# Patient Record
Sex: Female | Born: 1935 | Race: White | Hispanic: No | Smoking: Never smoker
Health system: Southern US, Community
[De-identification: ages and names within clinical notes are randomized; demographics above are authoritative.]

## PROBLEM LIST (undated history)

## (undated) DIAGNOSIS — Z1239 Encounter for other screening for malignant neoplasm of breast: Secondary | ICD-10-CM

## (undated) DIAGNOSIS — Z1211 Encounter for screening for malignant neoplasm of colon: Secondary | ICD-10-CM

## (undated) DIAGNOSIS — E669 Obesity, unspecified: Secondary | ICD-10-CM

## (undated) DIAGNOSIS — K921 Melena: Secondary | ICD-10-CM

## (undated) DIAGNOSIS — K219 Gastro-esophageal reflux disease without esophagitis: Secondary | ICD-10-CM

## (undated) DIAGNOSIS — E079 Disorder of thyroid, unspecified: Secondary | ICD-10-CM

## (undated) DIAGNOSIS — K2270 Barrett's esophagus without dysplasia: Secondary | ICD-10-CM

## (undated) DIAGNOSIS — F0390 Unspecified dementia without behavioral disturbance: Secondary | ICD-10-CM

## (undated) HISTORY — PX: TUBAL LIGATION: SHX77

## (undated) HISTORY — DX: Barrett's esophagus without dysplasia: K22.70

## (undated) HISTORY — PX: COLONOSCOPY: SHX174

## (undated) HISTORY — DX: Obesity, unspecified: E66.9

## (undated) HISTORY — DX: Melena: K92.1

## (undated) HISTORY — DX: Gastro-esophageal reflux disease without esophagitis: K21.9

## (undated) HISTORY — DX: Disorder of thyroid, unspecified: E07.9

## (undated) HISTORY — DX: Encounter for other screening for malignant neoplasm of breast: Z12.39

## (undated) HISTORY — DX: Encounter for screening for malignant neoplasm of colon: Z12.11

---

## 2004-08-13 ENCOUNTER — Ambulatory Visit: Payer: Self-pay | Admitting: General Surgery

## 2010-05-13 DIAGNOSIS — K2270 Barrett's esophagus without dysplasia: Secondary | ICD-10-CM

## 2010-05-13 HISTORY — PX: UPPER GASTROINTESTINAL ENDOSCOPY: SHX188

## 2010-05-13 HISTORY — DX: Barrett's esophagus without dysplasia: K22.70

## 2010-11-30 LAB — HM PAP SMEAR: HM PAP: NEGATIVE

## 2010-12-14 DIAGNOSIS — K219 Gastro-esophageal reflux disease without esophagitis: Secondary | ICD-10-CM

## 2010-12-14 HISTORY — DX: Gastro-esophageal reflux disease without esophagitis: K21.9

## 2010-12-15 DIAGNOSIS — K921 Melena: Secondary | ICD-10-CM

## 2010-12-15 HISTORY — DX: Melena: K92.1

## 2010-12-18 ENCOUNTER — Ambulatory Visit: Payer: Self-pay | Admitting: General Surgery

## 2010-12-18 LAB — HM COLONOSCOPY

## 2010-12-24 DIAGNOSIS — K2270 Barrett's esophagus without dysplasia: Secondary | ICD-10-CM

## 2010-12-24 HISTORY — DX: Barrett's esophagus without dysplasia: K22.70

## 2011-05-14 DIAGNOSIS — Z1239 Encounter for other screening for malignant neoplasm of breast: Secondary | ICD-10-CM

## 2011-05-14 DIAGNOSIS — E669 Obesity, unspecified: Secondary | ICD-10-CM

## 2011-05-14 DIAGNOSIS — Z1211 Encounter for screening for malignant neoplasm of colon: Secondary | ICD-10-CM

## 2011-05-14 HISTORY — DX: Encounter for other screening for malignant neoplasm of breast: Z12.39

## 2011-05-14 HISTORY — DX: Obesity, unspecified: E66.9

## 2011-05-14 HISTORY — DX: Encounter for screening for malignant neoplasm of colon: Z12.11

## 2011-07-12 ENCOUNTER — Ambulatory Visit: Payer: Self-pay | Admitting: General Surgery

## 2011-07-15 LAB — PATHOLOGY REPORT

## 2012-05-20 ENCOUNTER — Ambulatory Visit: Payer: Self-pay | Admitting: Family Medicine

## 2012-06-03 ENCOUNTER — Ambulatory Visit: Payer: Self-pay | Admitting: Family Medicine

## 2012-06-03 LAB — HM MAMMOGRAPHY

## 2012-06-20 ENCOUNTER — Encounter: Payer: Self-pay | Admitting: *Deleted

## 2012-06-20 DIAGNOSIS — K921 Melena: Secondary | ICD-10-CM | POA: Insufficient documentation

## 2012-06-20 DIAGNOSIS — K2270 Barrett's esophagus without dysplasia: Secondary | ICD-10-CM | POA: Insufficient documentation

## 2012-06-22 ENCOUNTER — Ambulatory Visit: Payer: Self-pay | Admitting: Otolaryngology

## 2012-06-24 LAB — PATHOLOGY REPORT

## 2012-07-22 ENCOUNTER — Ambulatory Visit: Payer: Self-pay | Admitting: General Surgery

## 2012-07-23 LAB — PATHOLOGY REPORT

## 2012-08-10 ENCOUNTER — Encounter: Payer: Self-pay | Admitting: General Surgery

## 2013-05-13 HISTORY — PX: THYROIDECTOMY: SHX17

## 2013-07-28 ENCOUNTER — Ambulatory Visit: Payer: Self-pay | Admitting: Family Medicine

## 2013-12-23 ENCOUNTER — Emergency Department: Payer: Self-pay | Admitting: Emergency Medicine

## 2014-07-28 ENCOUNTER — Encounter: Payer: Self-pay | Admitting: General Surgery

## 2014-08-02 NOTE — Progress Notes (Signed)
Order(s) created erroneously. Erroneous order ID: [id number]  Order moved by: Alfonso Patten  Order move date/time: 08/02/2014 10:30 AM  Source Patient: [id number]  Source Contact: 08/10/2012  Destination Patient: [id number]  Destination Contact: 07/28/2012

## 2014-08-03 ENCOUNTER — Encounter: Payer: Self-pay | Admitting: General Surgery

## 2014-08-10 LAB — HEPATIC FUNCTION PANEL
ALT: 12 U/L (ref 7–35)
AST: 14 U/L (ref 13–35)

## 2014-08-10 LAB — CBC AND DIFFERENTIAL
HCT: 40 % (ref 36–46)
Hemoglobin: 13.5 g/dL (ref 12.0–16.0)
Platelets: 300 10*3/uL (ref 150–399)
WBC: 7.6 10^3/mL

## 2014-08-10 LAB — LIPID PANEL
Cholesterol: 215 mg/dL — AB (ref 0–200)
HDL: 58 mg/dL (ref 35–70)
LDL Cholesterol: 127 mg/dL
Triglycerides: 149 mg/dL (ref 40–160)

## 2014-08-10 LAB — BASIC METABOLIC PANEL
BUN: 13 mg/dL (ref 4–21)
CREATININE: 1 mg/dL (ref 0.5–1.1)
GLUCOSE: 90 mg/dL
POTASSIUM: 4.6 mmol/L (ref 3.4–5.3)
SODIUM: 142 mmol/L (ref 137–147)

## 2014-08-11 ENCOUNTER — Ambulatory Visit
Admit: 2014-08-11 | Disposition: A | Discharge status: Home / Self Care | Payer: Self-pay | Attending: Family Medicine | Admitting: Family Medicine

## 2014-08-11 LAB — HM DEXA SCAN: HM Dexa Scan: NORMAL

## 2014-08-23 ENCOUNTER — Encounter: Payer: Self-pay | Admitting: General Surgery

## 2014-08-24 ENCOUNTER — Ambulatory Visit (INDEPENDENT_AMBULATORY_CARE_PROVIDER_SITE_OTHER): Payer: Medicare Other | Admitting: General Surgery

## 2014-08-24 ENCOUNTER — Encounter: Payer: Self-pay | Admitting: General Surgery

## 2014-08-24 VITALS — BP 160/78 | HR 78 | Resp 14 | Ht 68.0 in | Wt 216.0 lb

## 2014-08-24 DIAGNOSIS — K2270 Barrett's esophagus without dysplasia: Secondary | ICD-10-CM | POA: Diagnosis not present

## 2014-08-24 NOTE — Patient Instructions (Addendum)
Esophagogastroduodenoscopy Esophagogastroduodenoscopy (EGD) is a procedure to examine the lining of the esophagus, stomach, and first part of the small intestine (duodenum). A long, flexible, lighted tube with a camera attached (endoscope) is inserted down the throat to view these organs. This procedure is done to detect problems or abnormalities, such as inflammation, bleeding, ulcers, or growths, in order to treat them. The procedure lasts about 5-20 minutes. It is usually an outpatient procedure, but it may need to be performed in emergency cases in the hospital. LET YOUR CAREGIVER KNOW ABOUT:   Allergies to food or medicine.  All medicines you are taking, including vitamins, herbs, eyedrops, and over-the-counter medicines and creams.  Use of steroids (by mouth or creams).  Previous problems you or members of your family have had with the use of anesthetics.  Any blood disorders you have.  Previous surgeries you have had.  Other health problems you have.  Possibility of pregnancy, if this applies. RISKS AND COMPLICATIONS  Generally, EGD is a safe procedure. However, as with any procedure, complications can occur. Possible complications include:  Infection.  Bleeding.  Tearing (perforation) of the esophagus, stomach, or duodenum.  Difficulty breathing or not being able to breath.  Excessive sweating.  Spasms of the larynx.  Slowed heartbeat.  Low blood pressure. BEFORE THE PROCEDURE  Do not eat or drink anything for 6-8 hours before the procedure or as directed by your caregiver.  Ask your caregiver about changing or stopping your regular medicines.  If you wear dentures, be prepared to remove them before the procedure.  Arrange for someone to drive you home after the procedure. PROCEDURE   A vein will be accessed to give medicines and fluids. A medicine to relax you (sedative) and a pain reliever will be given through that access into the vein.  A numbing medicine  (local anesthetic) may be sprayed on your throat for comfort and to stop you from gagging or coughing.  A mouth guard may be placed in your mouth to protect your teeth and to keep you from biting on the endoscope.  You will be asked to lie on your left side.  The endoscope is inserted down your throat and into the esophagus, stomach, and duodenum.  Air is put through the endoscope to allow your caregiver to view the lining of your esophagus clearly.  The esophagus, stomach, and duodenum is then examined. During the exam, your caregiver may:  Remove tissue to be examined under a microscope (biopsy) for inflammation, infection, or other medical problems.  Remove growths.  Remove objects (foreign bodies) that are stuck.  Treat any bleeding with medicines or other devices that stop tissues from bleeding (hot cautery, clipping devices).  Widen (dilate) or stretch narrowed areas of the esophagus and stomach.  The endoscope will then be withdrawn. AFTER THE PROCEDURE  You will be taken to a recovery area to be monitored. You will be able to go home once you are stable and alert.  Do not eat or drink anything until the local anesthetic and numbing medicines have worn off. You may choke.  It is normal to feel bloated, have pain with swallowing, or have a sore throat for a short time. This will wear off.  Your caregiver should be able to discuss his or her findings with you. It will take longer to discuss the test results if any biopsies were taken. Document Released: 08/30/2004 Document Revised: 09/13/2013 Document Reviewed: 04/01/2012 [facility] Patient Information 2015 [place], [place]. This information is not  intended to replace advice given to you by your health care provider. Make sure you discuss any questions you have with your health care provider.  Patient has been scheduled for an upper endoscopy on 09-28-14 at [facility].

## 2014-08-24 NOTE — Progress Notes (Signed)
Patient ID: Kathryn Brooks, female   DOB: [birth year], 79 y.o.   MRN: [id number]  Chief Complaint  Patient presents with  . Other    discuss upper endoscopy    HPI Kathryn Brooks Hepp is a 79 y.o. female here today for her two year follow up recall to discuss endoscopy, last one done on 07/12/12.  HPI  Past Medical History  Diagnosis Date  . Barrett's esophagus [id number]    biopsies were completed at 34,32,30,and 26 cm from the incisors. gastric cardia type mucosa was identified from 30-34 cm. Squamocolumnar mucosa with reflux gastroesophagitis and Barrett's esophagus was noted at 26 cm. No atypia or dysplasia was identified. The previous biopsies of December 18, 2010 at 25,30,35 cm showed similar changes. There has been no progression of the Barrett's(long segment) and  . Blood in stool [id number]  . Breast screening, unspecified 2013  . Special screening for malignant neoplasms, colon 2013  . Obesity, unspecified 2013  . GERD (gastroesophageal reflux disease) [id number]  . Thyroid disease   . Barrett's esophagus determined by biopsy 2012    No atypia on March 2014 biopsy.    Past Surgical History  Procedure Laterality Date  . Tubal ligation    . Upper gastrointestinal endoscopy N/A 2012    chronic grastitis, Barrett's esophagus  . Colonoscopy N/A [id number]    heme positive stools  . Thyroidectomy Right 2015    Family History  Problem Relation Age of Onset  . Colon cancer Mother     07/19/2004    Social History History  Substance Use Topics  . Smoking status: Never Smoker   . Smokeless tobacco: Not on file  . Alcohol Use: No    No Known Allergies  Current Outpatient Prescriptions  Medication Sig Dispense Refill  . cholecalciferol (VITAMIN D) 1000 UNITS tablet Take 2,000 Units by mouth daily.    Marland Kitchen levothyroxine (SYNTHROID, LEVOTHROID) 25 MCG tablet Take 25 mcg by mouth daily before breakfast.   0  . levothyroxine (SYNTHROID, LEVOTHROID) 75 MCG tablet Take 75 mcg by mouth daily.    3  . omeprazole (PRILOSEC) 20 MG capsule Take 20 mg by mouth 2 (two) times daily before a meal.     No current facility-administered medications for this visit.    Review of Systems Review of Systems  Constitutional: Negative.   Respiratory: Negative.   Cardiovascular: Negative.     Blood pressure 160/78, pulse 78, resp. rate 14, height 5\' 8"  (1.727 m), weight 216 lb (97.977 kg).  Physical Exam Physical Exam  Constitutional: She is oriented to person, place, and time. She appears well-developed and well-nourished.  Eyes: Conjunctivae are normal. No scleral icterus.  Neck: Neck supple.  Cardiovascular: Normal rate, regular rhythm and normal heart sounds.   Pulmonary/Chest: Effort normal.  Lymphadenopathy:    She has no cervical adenopathy.  Neurological: She is alert and oriented to person, place, and time.  Skin: Skin is warm and dry.    Data Reviewed Previous endoscopy reports.  Assessment    Barrett's esophagus without atypia.    Plan    Discussed endoscopy with patient.    Patient has been scheduled for an upper endoscopy on 09-28-14 at [facility].   PCP:  Etheleen Mayhew 08/27/2014, 10:37 AM

## 2014-08-27 ENCOUNTER — Other Ambulatory Visit: Payer: Self-pay | Admitting: General Surgery

## 2014-08-27 ENCOUNTER — Encounter: Payer: Self-pay | Admitting: General Surgery

## 2014-08-27 DIAGNOSIS — K2270 Barrett's esophagus without dysplasia: Secondary | ICD-10-CM

## 2014-08-29 ENCOUNTER — Other Ambulatory Visit: Payer: Self-pay | Admitting: General Surgery

## 2014-08-29 DIAGNOSIS — K2270 Barrett's esophagus without dysplasia: Secondary | ICD-10-CM

## 2014-09-02 NOTE — Discharge Summary (Signed)
Dates of Admission and Diagnosis:  Date of Admission 22-Jun-2012   Date of Discharge 23-Jun-2012   Admitting Diagnosis s/p right hemithyroidectomy   Final Diagnosis s/p right hemithyroidectomy   Discharge Diagnosis 1 s/p right hemithyroidectomy    Chief Complaint/History of Present Illness 79 year old female s/p right hemithyroidectomy for right thyroid nodule.  Pt. with history of prior nodulectomy on right as well.   PERTINENT RADIOLOGY STUDIES: [place]:    08-Jan-14 13:50, US Soft Tissue Head/Neck/Thyroid  US Soft Tissue Head/Neck/Thyroid   REASON FOR EXAM:    thyroid enlargement  COMMENTS:       PROCEDURE: KUS - KUS SOFT TISSUE HEAD/NECK/THYROI  - May 20 2012  1:50PM     RESULT: Indication: Thyroid enlargement    Comparison: None    Technique: Multiple gray-scale and color-flow Dopplerimages of the   thyroid gland are presented for review.    Findings:    The right lobe of the thyroid gland measures 6.2 x 2.9 x 2.5 cm. The     right thyroid demonstrates normal echogenicity. There is a 4.5 x 2.5 x 4   cm complex cystic right thyroid mass extending into the thyroid isthmus   with multiple septations and nodular areas.    The left lobe of the thyroid gland measures 5.8 x 1.8 x 1.9 cm. The left   thyroid demonstrates normal echogenicity. There is a 7 x 3 x 6 mm   hypoechoic nodulein the upper left thyroid gland.    The thyroid isthmus is normal measuring 22 mm.    IMPRESSION:     1. Large complex cystic right thyroid mass extending into the thyroid   isthmus.   2. Small subcentimeter left upper thyroid nodule.    There are nosensitive or specific ultrasound criteria for   differentiating benign from malignant thyroid lesions.    Dictation Site: 1        Verified By: Jennette Banker, M.D., MD   Hospital Course:  Hospital Course Admitted to floor following procedure.  No issues.  Pain controlled.  ambulating well.  Drains with 20ccs of output and removed.    Condition on Discharge Good   DISCHARGE INSTRUCTIONS HOME MEDS:  Medication Reconciliation: Patient's Home Medications at Discharge:     Medication Instructions  prilosec otc 20 mg oral delayed release tablet  1 tab(s) orally 2 times a day   magnesium oxide 400 mg (241.3 mg elemental magnesium) oral tablet  1 tab(s) orally once a day (in the morning)   vitamin d3 2000 intl units oral tablet  1 tab(s) orally once a day   claritin 10 mg oral tablet  1 tab(s) orally once a day   aspirin 81 mg oral tablet  1 tab(s) orally once a day   acetaminophen-hydrocodone 500 mg-7.5 mg/15 ml oral elixir  10 milliliter(s) orally every 4 hours, As needed, pain   promethazine 12.5 mg oral tablet  1 tab(s) orally     PRESCRIPTIONS: PRINTED AND PLACED ON CHART   Physician's Instructions:  Home Health? No   Treatments None   Dressing Care Keep dressing dry  may shower but pat area dry   Home Oxygen? No   Diet Regular   Diet Consistency Regular Consistency   Activity Limitations No exertional activity  No heavy lifting   Return to Work Not Applicable   Time frame for Follow Up Appointment 10 days [facility]   Electronic Signatures: Emiya Loomer, Shela Leff (MD)  (Signed 11-Feb-14 07:28)  Authored: ADMISSION DATE AND DIAGNOSIS, CHIEF COMPLAINT/HPI, PERTINENT RADIOLOGY STUDIES, HOSPITAL COURSE, DISCHARGE INSTRUCTIONS HOME MEDS, PATIENT INSTRUCTIONS   Last Updated: 11-Feb-14 07:28 by Pascal Lux (MD)

## 2014-09-02 NOTE — Op Note (Signed)
PATIENT NAME:  Kathryn Brooks, Kathryn Brooks MR#:  [id number] DATE OF BIRTH:  25-Jan-1936  DATE OF PROCEDURE:  06/22/2012  PREOPERATIVE DIAGNOSIS: Right thyroid nodule.   POSTOPERATIVE DIAGNOSIS: Right thyroid nodule.  PROCEDURE PERFORMED: Minimally invasive right hemi-thyroidectomy with laryngeal nerve monitoring.   SURGEON: Jerene Bears, M.D.   ASSISTANT: Dr. Nadeen Landau.  ANESTHESIA: General endotracheal anesthesia.   ESTIMATED BLOOD LOSS: 50 mL.   IV FLUIDS: Please see anesthesia record.   COMPLICATIONS: None.   DRAINS/STENT PLACEMENTS: A TLS drain.   SPECIMENS: Right hemi-thyroid.    INDICATIONS FOR PROCEDURE: The patient is a 79 year old female with a long-standing history of a thyroid nodule found on physical examination. The patient presents for excision. The patient has a history of a "cyst" excision from her anterior neck 40 to 50 years ago. She is unsure exactly what the name of the surgery was.   OPERATIVE FINDINGS: Severely scarred right hemi-thyroid with history of, what appeared to be, prior partial thyroidectomy versus a nodulectomy. Right recurrent laryngeal nerve was identified and noted to be scarred to the anterior tracheal wall in the trachea and was traced from inferior in the neck superiorly to its insertion. The right inferior parathyroid gland was identified and preserved. The right superior thyroid gland was not identified.   DESCRIPTION OF PROCEDURE: After the patient was identified in holding, benefits and risks of the procedure were discussed and consent was reviewed, the patient was taken to the operating room and placed in the supine position. General endotracheal anesthesia was induced with the recurrent laryngeal nerve monitor. Care was taken to ensure proper positioning of the monitoring device at the level of the vocal cords. At this time 5 mL of 0.25% Marcaine with 1: 200,000 epinephrine were injected in the patient's anterior neck and the patient was prepped  and draped in sterile fashion. A #15 blade scalpel was used to make an incision on her previous skin incision site just at the medial aspect of this. The previous scar was excised. Dissection was carried through subcutaneous tissue and significant scarring through the level of the platysma. Strap muscles were identified and scarred down. The median raphe was divided. The sternohyoid and sternothyroid muscles were severely scarred down to the right hemi-thyroid. A large cystic mass was identified. This was from the right hemi-thyroid extending over into the isthmus. The lateral border of the right thyroid was identified. Again, significant scarring was noted. The great vessels were identified. The superior pole was identified and ligated with the Harmonic scalpel and then mobilization of the right hemi-thyroid was made more inferiorly.   At this point, the recurrent laryngeal nerve was identified in the tracheoesophageal groove. This was robustly stimulated throughout the duration of the case with a laryngeal nerve monitoring device. Using a right angle, the recurrent laryngeal nerve was traced superiorly. The right inferior parathyroid gland was identified in this location and the blood supply was preserved. The right inferior thyroid artery was noted to be coursing beneath the recurrent laryngeal nerve and this was divided on the anterior border of the patient's trachea. The recurrent laryngeal nerve was noted to be adherent to the patient's tracheal wall and dissection from the scarred thyroid to the trachea was meticulously performed at a right angle. The nerve was traced up until its insertion into the patient's larynx and at this time, the remaining attachments of the right hemi-thyroid were divided from the tracheal wall. Care was taken to avoid injury to the right recurrent laryngeal nerve. Again,  there was significant scarring to the superficial and musculature as well as the trachea secondary to previous  surgery. At this time, dissection was carried just across midline until the large right thyroid and isthmus cyst was completely dissected and then a Harmonic scalpel was used to divided the isthmus. Care was taken to avoid going too far on the patient's left thyroid to damage to the nerve and then inferior attachments to the fatty tissue consistent with the mediastinum separated from the inferior thyroid.   At this time, the patient's wound was copiously irrigated with sterile saline. Meticulous hemostasis was achieved using the bipolar. The patient's right recurrent laryngeal nerve again was stimulated. The right inferior parathyroid gland was visualized and noted to be intact. Surgicel was placed along the thyroid wound bed. A TLS drain was placed coming out of the patient's left neck and the strap muscles were reapproximated in a single figure-of-eight stitch and then the skin was reapproximated using 4-0 Vicryl in an interrupted fashion and the skin was closed with Dermabond skin adhesive. At this time, the care of the patient was transferred to anesthesia.   ____________________________ Jerene Bears, MD ccv:aw D: 06/22/2012 09:42:04 ET T: 06/22/2012 10:09:03 ET JOB#: [id number]  cc: Jerene Bears, MD, <Dictator> Jerene Bears MD ELECTRONICALLY SIGNED 07/02/2012 11:12

## 2014-09-22 ENCOUNTER — Telehealth: Payer: Self-pay | Admitting: *Deleted

## 2014-09-22 NOTE — Telephone Encounter (Signed)
Patient confirms no medication changes since last office visit.   This patient also states she has pre-registered.   We will proceed with upper endoscopy that is scheduled for 09-28-14 at [facility].

## 2014-09-27 ENCOUNTER — Encounter: Payer: Self-pay | Admitting: *Deleted

## 2014-09-28 ENCOUNTER — Ambulatory Visit: Payer: Medicare Other | Admitting: Anesthesiology

## 2014-09-28 ENCOUNTER — Encounter
Admission: RE | Disposition: A | Discharge status: Home / Self Care | Payer: Self-pay | Source: Ambulatory Visit | Attending: General Surgery

## 2014-09-28 ENCOUNTER — Ambulatory Visit
Admission: RE | Admit: 2014-09-28 | Discharge: 2014-09-28 | Disposition: A | Discharge status: Home / Self Care | Payer: Medicare Other | Source: Ambulatory Visit | Attending: General Surgery | Admitting: General Surgery

## 2014-09-28 DIAGNOSIS — E669 Obesity, unspecified: Secondary | ICD-10-CM | POA: Insufficient documentation

## 2014-09-28 DIAGNOSIS — K219 Gastro-esophageal reflux disease without esophagitis: Secondary | ICD-10-CM | POA: Diagnosis not present

## 2014-09-28 DIAGNOSIS — Z800 Family history of malignant neoplasm of digestive organs: Secondary | ICD-10-CM | POA: Diagnosis not present

## 2014-09-28 DIAGNOSIS — K2270 Barrett's esophagus without dysplasia: Secondary | ICD-10-CM | POA: Insufficient documentation

## 2014-09-28 DIAGNOSIS — Z79899 Other long term (current) drug therapy: Secondary | ICD-10-CM | POA: Diagnosis not present

## 2014-09-28 DIAGNOSIS — E890 Postprocedural hypothyroidism: Secondary | ICD-10-CM | POA: Diagnosis not present

## 2014-09-28 DIAGNOSIS — E079 Disorder of thyroid, unspecified: Secondary | ICD-10-CM | POA: Diagnosis not present

## 2014-09-28 DIAGNOSIS — K449 Diaphragmatic hernia without obstruction or gangrene: Secondary | ICD-10-CM | POA: Insufficient documentation

## 2014-09-28 HISTORY — PX: ESOPHAGOGASTRODUODENOSCOPY: SHX5428

## 2014-09-28 SURGERY — EGD (ESOPHAGOGASTRODUODENOSCOPY)
Anesthesia: General

## 2014-09-28 MED ORDER — PROPOFOL 10 MG/ML IV BOLUS
INTRAVENOUS | Status: DC | PRN
  Administered 2014-09-28: 50 mg via INTRAVENOUS

## 2014-09-28 MED ORDER — MIDAZOLAM HCL 2 MG/2ML IJ SOLN
INTRAMUSCULAR | Status: DC | PRN
  Administered 2014-09-28: 1 mg via INTRAVENOUS

## 2014-09-28 MED ORDER — GLYCOPYRROLATE 0.2 MG/ML IJ SOLN
INTRAMUSCULAR | Status: DC | PRN
  Administered 2014-09-28: 0.2 mg via INTRAVENOUS

## 2014-09-28 MED ORDER — LIDOCAINE HCL (CARDIAC) 20 MG/ML IV SOLN
INTRAVENOUS | Status: DC | PRN
  Administered 2014-09-28: 60 mg via INTRAVENOUS

## 2014-09-28 MED ORDER — PROPOFOL INFUSION 10 MG/ML OPTIME
INTRAVENOUS | Status: DC | PRN
  Administered 2014-09-28: 150 ug/kg/min via INTRAVENOUS

## 2014-09-28 MED ORDER — SODIUM CHLORIDE 0.45 % IV SOLN
INTRAVENOUS | Status: DC
  Administered 2014-09-28: 11:00:00 via INTRAVENOUS

## 2014-09-28 MED ORDER — SODIUM CHLORIDE 0.45 % IV SOLN
INTRAVENOUS | Status: DC
  Administered 2014-09-28: 1000 mL via INTRAVENOUS

## 2014-09-28 NOTE — H&P (Signed)
HPI Kathryn Brooks is a 79 y.o. female here today for her two year follow up recall to discuss endoscopy, last one done on 07/12/12.  HPI  Past Medical History  Diagnosis Date  . Barrett's esophagus [id number]    biopsies were completed at 34,32,30,and 26 cm from the incisors. gastric cardia type mucosa was identified from 30-34 cm. Squamocolumnar mucosa with reflux gastroesophagitis and Barrett's esophagus was noted at 26 cm. No atypia or dysplasia was identified. The previous biopsies of December 18, 2010 at 25,30,35 cm showed similar changes. There has been no progression of the Barrett's(long segment) and  . Blood in stool [id number]  . Breast screening, unspecified 2013  . Special screening for malignant neoplasms, colon 2013  . Obesity, unspecified 2013  . GERD (gastroesophageal reflux disease) [id number]  . Thyroid disease   . Barrett's esophagus determined by biopsy 2012    No atypia on March 2014 biopsy.    Past Surgical History  Procedure Laterality Date  . Tubal ligation    . Upper gastrointestinal endoscopy N/A 2012    chronic grastitis, Barrett's esophagus  . Colonoscopy N/A [id number]    heme positive stools  . Thyroidectomy Right 2015    Family History  Problem Relation Age of Onset  . Colon cancer Mother     07/19/2004    Social History History  Substance Use Topics  . Smoking status: Never Smoker   . Smokeless tobacco: Not on file  . Alcohol Use: No    No Known Allergies  Current Outpatient Prescriptions  Medication Sig Dispense Refill  . cholecalciferol (VITAMIN D) 1000 UNITS tablet Take 2,000 Units by mouth daily.    Marland Kitchen levothyroxine (SYNTHROID, LEVOTHROID) 25 MCG tablet Take 25 mcg by mouth daily before breakfast.   0  . levothyroxine (SYNTHROID, LEVOTHROID) 75 MCG tablet Take 75 mcg by mouth daily.   3  . omeprazole (PRILOSEC) 20 MG capsule Take 20 mg  by mouth 2 (two) times daily before a meal.     No current facility-administered medications for this visit.    Review of Systems Review of Systems  Constitutional: Negative.  Respiratory: Negative.  Cardiovascular: Negative.    Blood pressure 160/78, pulse 78, resp. rate 14, height 5\' 8"  (1.727 m), weight 216 lb (97.977 kg).  Physical Exam Physical Exam  Constitutional: She is oriented to person, place, and time. She appears well-developed and well-nourished.  Eyes: Conjunctivae are normal. No scleral icterus.  Neck: Neck supple.  Cardiovascular: Normal rate, regular rhythm and normal heart sounds.  Pulmonary/Chest: Effort normal.  Lymphadenopathy:   She has no cervical adenopathy.  Neurological: She is alert and oriented to person, place, and time.  Skin: Skin is warm and dry.    Data Reviewed Previous endoscopy reports.  Assessment    Barrett's esophagus without atypia.    Plan    Discussed endoscopy with patient.    Patient has been scheduled for an upper endoscopy on 09-28-14 at [facility].   No change in clinical history.  Lungs: Clear. Cardio: RR. HEENT: Neg.  Plan: EGD.

## 2014-09-28 NOTE — Anesthesia Preprocedure Evaluation (Signed)
Anesthesia Evaluation  Patient identified by MRN, date of birth, ID band Patient awake    Reviewed: Allergy & Precautions, NPO status   History of Anesthesia Complications Negative for: history of anesthetic complications  Airway Mallampati: II       Dental  (+) Upper Dentures, Partial Lower   Pulmonary neg pulmonary ROS,    Pulmonary exam normal       Cardiovascular Exercise Tolerance: Good negative cardio ROS Normal cardiovascular exam    Neuro/Psych negative neurological ROS     GI/Hepatic Neg liver ROS, GERD-  ,  Endo/Other  negative endocrine ROS  Renal/GU negative Renal ROS  negative genitourinary   Musculoskeletal negative musculoskeletal ROS (+)   Abdominal Normal abdominal exam  (+)   Peds negative pediatric ROS (+)  Hematology negative hematology ROS (+)   Anesthesia Other Findings   Reproductive/Obstetrics negative OB ROS                             Anesthesia Physical Anesthesia Plan  ASA: II  Anesthesia Plan: General   Post-op Pain Management:    Induction: Intravenous  Airway Management Planned: Nasal Cannula  Additional Equipment:   Intra-op Plan:   Post-operative Plan:   Informed Consent: I have reviewed the patients History and Physical, chart, labs and discussed the procedure including the risks, benefits and alternatives for the proposed anesthesia with the patient or authorized representative who has indicated his/her understanding and acceptance.     Plan Discussed with: CRNA and Surgeon  Anesthesia Plan Comments:         Anesthesia Quick Evaluation

## 2014-09-28 NOTE — Discharge Instructions (Signed)
Resume regular medications.  Soft diet today. Advance as tolerated. Bread/ toast the last food to add.  Call if you have not heard about your biopsy results in one week.

## 2014-09-28 NOTE — Op Note (Signed)
[facility] Gastroenterology Patient Name: Kathryn Brooks Procedure Date: 09/28/2014 11:09 AM MRN: [id number] Account #: [id number] Date of Birth: 10-07-1935 Admit Type: Outpatient Age: 79 Room: [facility] ENDO ROOM 1 Gender: Female Note Status: Finalized Procedure:         Upper GI endoscopy Indications:       Surveillance procedure, Barrett's esophagus Providers:         Robert Bellow, MD Referring MD:      No Local Md, MD (Referring MD) Medicines:         Monitored Anesthesia Care Complications:     No immediate complications. Procedure:         Pre-Anesthesia Assessment:                    - Prior to the procedure, a History and Physical was                     performed, and patient medications, allergies and                     sensitivities were reviewed. The patient's tolerance of                     previous anesthesia was reviewed.                    - The risks and benefits of the procedure and the sedation                     options and risks were discussed with the patient. All                     questions were answered and informed consent was obtained.                    After obtaining informed consent, the endoscope was passed                     under direct vision. Throughout the procedure, the                     patient's blood pressure, pulse, and oxygen saturations                     were monitored continuously. The Olympus GIF-160 endoscope                     (S#. [id number]) was introduced through the mouth, and                     advanced to the second part of duodenum. The upper GI                     endoscopy was accomplished without difficulty. The patient                     tolerated the procedure well. The patient tolerated the                     procedure fairly well. Findings:      The stomach was normal.      The examined duodenum was normal.      A medium-sized hiatus hernia was present.      The esophagus and  gastroesophageal junction were examined with  white       light. There were esophageal mucosal changes secondary to established       long-segment Barrett's disease, extending from the upper extent of the       gastric folds which were at 38 cm from the incisors. Salmon-colored       mucosa was present. The maximum longitudinal extent of these esophageal       mucosal changes was 9 cm in length. Mucosa was biopsied with a cold       forceps for histology in 3 sections 25,35 and 37 cm from the incisiors.       in the middle third of the esophagus and in the lower third of the       esophagus. A total of 3 specimen bottles were sent to pathology. Impression:        - Normal stomach.                    - Normal examined duodenum.                    - Medium-sized hiatus hernia.                    - Esophageal mucosal changes secondary to established                     long-segment Barrett's disease. Biopsied. Recommendation:    - Telephone endoscopist for pathology results in 1 week. Procedure Code(s): --- Professional ---                    [phone], Esophagogastroduodenoscopy, flexible, transoral;                     with biopsy, single or multiple Diagnosis Code(s): --- Professional ---                    K22.70, Barrett's esophagus without dysplasia                    K44.9, Diaphragmatic hernia without obstruction or gangrene CPT copyright 2014 American Medical Association. All rights reserved. The codes documented in this report are preliminary and upon coder review may  be revised to meet current compliance requirements. Robert Bellow, MD 09/28/2014 11:39:12 AM This report has been signed electronically. Number of Addenda: 0 Note Initiated On: 09/28/2014 11:09 AM      [facility]

## 2014-09-28 NOTE — Anesthesia Postprocedure Evaluation (Signed)
  Anesthesia Post-op Note  Patient: Kathryn Brooks  Procedure(s) Performed: Procedure(s): ESOPHAGOGASTRODUODENOSCOPY (EGD) (N/A)  Anesthesia type:General  Patient location: PACU  Post pain: Pain level controlled  Post assessment: Post-op Vital signs reviewed, Patient's Cardiovascular Status Stable, Respiratory Function Stable, Patent Airway and No signs of Nausea or vomiting  Post vital signs: Reviewed and stable  Last Vitals:  Filed Vitals:   09/28/14 1002  BP: 173/64  Pulse: 56  Temp: 35.8 C  Resp: 16    Level of consciousness: awake, alert  and patient cooperative  Complications: No apparent anesthesia complications

## 2014-09-28 NOTE — Transfer of Care (Signed)
Immediate Anesthesia Transfer of Care Note  Patient: Kathryn Brooks  Procedure(s) Performed: Procedure(s): ESOPHAGOGASTRODUODENOSCOPY (EGD) (N/A)  Patient Location: PACU  Anesthesia Type:General  Level of Consciousness: awake, alert , oriented and patient cooperative  Airway & Oxygen Therapy: Patient Spontanous Breathing and Patient connected to nasal cannula oxygen  Post-op Assessment: Report given to RN, Post -op Vital signs reviewed and stable and Patient moving all extremities X 4  Post vital signs: Reviewed and stable  Last Vitals:  Filed Vitals:   09/28/14 1147  BP: 108/68  Pulse: 85  Temp: 36.2 C  Resp: 16    Complications: No apparent anesthesia complications

## 2014-09-29 LAB — SURGICAL PATHOLOGY

## 2014-10-04 ENCOUNTER — Encounter: Payer: Self-pay | Admitting: General Surgery

## 2015-01-30 ENCOUNTER — Ambulatory Visit: Payer: Self-pay | Admitting: Family Medicine

## 2015-02-09 ENCOUNTER — Ambulatory Visit (INDEPENDENT_AMBULATORY_CARE_PROVIDER_SITE_OTHER): Payer: Medicare Other

## 2015-02-09 DIAGNOSIS — Z23 Encounter for immunization: Secondary | ICD-10-CM | POA: Diagnosis not present

## 2015-05-05 DIAGNOSIS — IMO0002 Reserved for concepts with insufficient information to code with codable children: Secondary | ICD-10-CM

## 2015-05-05 DIAGNOSIS — R195 Other fecal abnormalities: Secondary | ICD-10-CM | POA: Insufficient documentation

## 2015-05-05 DIAGNOSIS — M25519 Pain in unspecified shoulder: Secondary | ICD-10-CM | POA: Insufficient documentation

## 2015-05-05 DIAGNOSIS — E559 Vitamin D deficiency, unspecified: Secondary | ICD-10-CM | POA: Insufficient documentation

## 2015-05-05 DIAGNOSIS — E039 Hypothyroidism, unspecified: Secondary | ICD-10-CM | POA: Insufficient documentation

## 2015-05-05 DIAGNOSIS — K219 Gastro-esophageal reflux disease without esophagitis: Secondary | ICD-10-CM | POA: Insufficient documentation

## 2015-05-05 DIAGNOSIS — L989 Disorder of the skin and subcutaneous tissue, unspecified: Secondary | ICD-10-CM | POA: Insufficient documentation

## 2015-05-05 DIAGNOSIS — R0602 Shortness of breath: Secondary | ICD-10-CM | POA: Insufficient documentation

## 2015-05-05 DIAGNOSIS — E781 Pure hyperglyceridemia: Secondary | ICD-10-CM | POA: Insufficient documentation

## 2015-05-05 DIAGNOSIS — J309 Allergic rhinitis, unspecified: Secondary | ICD-10-CM | POA: Insufficient documentation

## 2015-05-05 DIAGNOSIS — R5383 Other fatigue: Secondary | ICD-10-CM | POA: Insufficient documentation

## 2015-05-05 DIAGNOSIS — E668 Other obesity: Secondary | ICD-10-CM | POA: Insufficient documentation

## 2015-05-05 DIAGNOSIS — E6689 Other obesity not elsewhere classified: Secondary | ICD-10-CM | POA: Insufficient documentation

## 2015-05-10 ENCOUNTER — Encounter: Payer: Self-pay | Admitting: Family Medicine

## 2015-05-10 ENCOUNTER — Ambulatory Visit (INDEPENDENT_AMBULATORY_CARE_PROVIDER_SITE_OTHER): Payer: Medicare Other | Admitting: Family Medicine

## 2015-05-10 VITALS — BP 142/72 | HR 68 | Temp 97.6°F | Resp 16 | Ht 68.0 in | Wt 226.0 lb

## 2015-05-10 DIAGNOSIS — G309 Alzheimer's disease, unspecified: Secondary | ICD-10-CM

## 2015-05-10 DIAGNOSIS — G3184 Mild cognitive impairment, so stated: Secondary | ICD-10-CM

## 2015-05-10 DIAGNOSIS — F0280 Dementia in other diseases classified elsewhere without behavioral disturbance: Secondary | ICD-10-CM | POA: Insufficient documentation

## 2015-05-10 MED ORDER — DONEPEZIL HCL 5 MG PO TABS: 5.0000 mg | ORAL_TABLET | Freq: Every day | ORAL | Status: DC

## 2015-05-10 NOTE — Progress Notes (Signed)
Subjective:    Patient ID: Kathryn Brooks, female    DOB: [birth year], 79 y.o.   MRN: [id number]  HPI  Memory Loss This has been going on for nearly one year per husband, and is worsening.  Patient does not notice it.  Husband says it is mostly short term memory loss. Re-asking question. Does do ok driving.  Does still keep up with the bills.  Does not loose things. No depression or anxiety.  No family history of memory issues.  No stroke symptoms.  Here with her husband today.  Tearful today.  Did go to her daughter's for Christmas.    Cognitive Testing - 6-CIT  Correct? Score   What year is it? yes 0 0 or 4  What month is it? yes 0 0 or 3  Memorize:    Pia Mau,  44,  [place],      What time is it? (within 1 hour) yes 0 0 or 3  Count backwards from 20 no 2 0, 2, or 4  Name the months of the year no 4 0, 2, or 4  Repeat name & address above no 2 0, 2, 4, 6, 8, or 10       TOTAL SCORE  8/28   Interpretation:  Abnormal- 8  Normal (0-7) Abnormal (8-28)     Review of Systems  Constitutional: Positive for fatigue. Negative for fever, chills, diaphoresis, activity change, appetite change and unexpected weight change.  Respiratory: Positive for cough. Negative for choking, chest tightness, shortness of breath and wheezing.   Cardiovascular: Positive for leg swelling. Negative for chest pain and palpitations.  Neurological: Positive for weakness (diffuse). Negative for facial asymmetry, speech difficulty and headaches.  Psychiatric/Behavioral: Positive for confusion (decreased short term memory.  ).   BP 142/72 mmHg  Pulse 68  Temp(Src) 97.6 F (36.4 C) (Oral)  Resp 16  Ht 5\' 8"  (1.727 m)  Wt 226 lb (102.513 kg)  BMI 34.37 kg/m2   Patient Active Problem List   Diagnosis Date Noted  . Allergic rhinitis 05/05/2015  . Adult BMI 30+ 05/05/2015  . Fatigue 05/05/2015  . Acid reflux 05/05/2015  . Fecal occult blood test positive 05/05/2015  . Hypertriglyceridemia  05/05/2015  . Breath shortness 05/05/2015  . Pain in shoulder 05/05/2015  . Skin lesion 05/05/2015  . Lump in thyroid 05/05/2015  . Avitaminosis D 05/05/2015  . Barrett's esophagus    Past Medical History  Diagnosis Date  . Barrett's esophagus [id number]    biopsies were completed at 34,32,30,and 26 cm from the incisors. gastric cardia type mucosa was identified from 30-34 cm. Squamocolumnar mucosa with reflux gastroesophagitis and Barrett's esophagus was noted at 26 cm. No atypia or dysplasia was identified. The previous biopsies of December 18, 2010 at 25,30,35 cm showed similar changes. There has been no progression of the Barrett's(long segment) and  . Blood in stool [id number]  . Breast screening, unspecified 2013  . Special screening for malignant neoplasms, colon 2013  . Obesity, unspecified 2013  . GERD (gastroesophageal reflux disease) [id number]  . Thyroid disease   . Barrett's esophagus determined by biopsy 2012    No atypia on March 2014 biopsy.   Current Outpatient Prescriptions on File Prior to Visit  Medication Sig  . levothyroxine (SYNTHROID, LEVOTHROID) 25 MCG tablet Take 12.5 mcg by mouth daily before breakfast. Take with 48mcg tab  . levothyroxine (SYNTHROID, LEVOTHROID) 75 MCG tablet Take 75 mcg by mouth daily before breakfast.  With 12.71mcg  . omeprazole (PRILOSEC) 40 MG capsule Take 1 capsule by mouth daily as needed. Acid reflux   No current facility-administered medications on file prior to visit.   No Known Allergies Past Surgical History  Procedure Laterality Date  . Tubal ligation    . Upper gastrointestinal endoscopy N/A 2012    chronic grastitis, Barrett's esophagus  . Colonoscopy N/A [id number]    heme positive stools  . Thyroidectomy Right 2015  . Esophagogastroduodenoscopy N/A 09/28/2014    Procedure: ESOPHAGOGASTRODUODENOSCOPY (EGD);  Surgeon: Robert Bellow, MD;  Location[facility] ENDOSCOPY;  Service: Endoscopy;  Laterality: N/A;   Social History    Social History  . Marital Status: Married    Spouse Name: N/A  . Number of Children: N/A  . Years of Education: N/A   Occupational History  . Not on file.   Social History Main Topics  . Smoking status: Never Smoker   . Smokeless tobacco: Never Used  . Alcohol Use: No  . Drug Use: No  . Sexual Activity: Not on file   Other Topics Concern  . Not on file   Social History Narrative   Family History  Problem Relation Age of Onset  . Colon cancer Mother     07/19/2004  . Cancer Mother       Objective:   Physical Exam  Constitutional: She is oriented to person, place, and time. She appears well-developed and well-nourished.  Cardiovascular: Normal rate and regular rhythm.   Pulmonary/Chest: Effort normal and breath sounds normal.  Neurological: She is alert and oriented to person, place, and time.  Psychiatric: She has a normal mood and affect. Her behavior is normal. Judgment and thought content normal.   BP 142/72 mmHg  Pulse 68  Temp(Src) 97.6 F (36.4 C) (Oral)  Resp 16  Ht 5\' 8"  (1.727 m)  Wt 226 lb (102.513 kg)  BMI 34.37 kg/m2      Assessment & Plan:  1. Mild cognitive impairment with memory loss New problem.   Will check labs, check scan and start medication. Discussed side effects with patient and husband.  Recheck in 4 weeks.   - Vitamin B12 - TSH - RPR - donepezil (ARICEPT) 5 MG tablet; Take 1 tablet (5 mg total) by mouth at bedtime.  Dispense: 30 tablet; Refill: Hastings, MD    - CT Head Wo Contrast; Future

## 2015-05-11 ENCOUNTER — Telehealth: Payer: Self-pay

## 2015-05-11 LAB — TSH: TSH: 2.06 u[IU]/mL (ref 0.450–4.500)

## 2015-05-11 LAB — RPR: RPR: NONREACTIVE

## 2015-05-11 LAB — VITAMIN B12: VITAMIN B 12: 249 pg/mL (ref 211–946)

## 2015-05-11 NOTE — Telephone Encounter (Signed)
-----   Message from Margarita Rana, MD sent at 05/11/2015  6:18 AM EST ----- Labs stable. Slightly low B12.  Start B12 1000 mcg daily. Thanks.

## 2015-05-11 NOTE — Telephone Encounter (Signed)
Advised pt of lab results. Pt verbally acknowledges understanding. Emily Drozdowski, CMA   

## 2015-05-19 ENCOUNTER — Telehealth: Payer: Self-pay

## 2015-05-19 ENCOUNTER — Ambulatory Visit
Admission: RE | Admit: 2015-05-19 | Discharge: 2015-05-19 | Disposition: A | Discharge status: Home / Self Care | Payer: Medicare Other | Source: Ambulatory Visit | Attending: Family Medicine | Admitting: Family Medicine

## 2015-05-19 ENCOUNTER — Ambulatory Visit: Payer: Medicare Other

## 2015-05-19 DIAGNOSIS — G3184 Mild cognitive impairment, so stated: Secondary | ICD-10-CM | POA: Diagnosis not present

## 2015-05-19 DIAGNOSIS — R413 Other amnesia: Secondary | ICD-10-CM | POA: Diagnosis present

## 2015-05-19 NOTE — Telephone Encounter (Signed)
Please check on this. Thought we were going to refer for memory loss. Thanks.

## 2015-05-19 NOTE — Telephone Encounter (Signed)
-----   Message from Margarita Rana, MD sent at 05/19/2015  2:52 PM EST ----- Age related changes. No acute abnormalities. Follow up with neurologist as discussed. Thanks.

## 2015-05-19 NOTE — Telephone Encounter (Signed)
Advised patient as below. Patient reports that she doesn't remember talking about seeing a neurologist. Does she need to? Please advise. Thanks!

## 2015-05-23 NOTE — Telephone Encounter (Signed)
Sarah, have you scheduled patient to see Neurologist yet?

## 2015-05-24 NOTE — Telephone Encounter (Signed)
Pt has not been referred to neurology.Need referral entered into EPIC

## 2015-05-24 NOTE — Telephone Encounter (Signed)
Referral added.   Thanks,   -Mickel Baas

## 2015-05-25 ENCOUNTER — Telehealth: Payer: Self-pay | Admitting: Family Medicine

## 2015-05-25 NOTE — Telephone Encounter (Signed)
Talked with patient. Will see how she is doing at end of month and make decision on neurology referral. Thanks.

## 2015-05-25 NOTE — Telephone Encounter (Signed)
Pt wants to know why she is needing to see a neurologist.She states that her memory loss is not that bad and her scan was negative. Pt  doesn't feel the need for appointment.Please advise

## 2015-05-25 NOTE — Telephone Encounter (Signed)
Please advise. Thanks.  

## 2015-06-12 ENCOUNTER — Ambulatory Visit (INDEPENDENT_AMBULATORY_CARE_PROVIDER_SITE_OTHER): Payer: Medicare Other | Admitting: Family Medicine

## 2015-06-12 ENCOUNTER — Encounter: Payer: Self-pay | Admitting: Family Medicine

## 2015-06-12 VITALS — BP 134/72 | HR 68 | Temp 97.6°F | Resp 16 | Wt 227.0 lb

## 2015-06-12 DIAGNOSIS — G3184 Mild cognitive impairment, so stated: Secondary | ICD-10-CM | POA: Diagnosis not present

## 2015-06-12 NOTE — Progress Notes (Signed)
   Subjective:    Patient ID: Kathryn Brooks, female    DOB: 1935-10-08, 80 y.o.   MRN: [id number]  HPI Memory Loss FU from 05/10/2015. Pt scored 8/28 on 6CIT at [facility]. Provider ordered labs and CT of head Wo contrast, all of which were WNL. Pt started on Donepezil 5 mg po qhs. Pt reports good compliance with treatment.  Cognitive Testing - 6-CIT  Correct? Score   What year is it? yes 0 0 or 4  What month is it? yes 0 0 or 3  Memorize:    Pia Mau,  44,  [address]  [place],      What time is it? (within 1 hour) yes 0 0 or 3  Count backwards from 20 no 4 0, 2, or 4  Name the months of the year yes 0 0, 2, or 4  Repeat name & address above no 8 0, 2, 4, 6, 8, or 10       TOTAL SCORE  12/28   Interpretation:  Abnormal- 12/28  Normal (0-7) Abnormal (8-28)     Review of Systems  Constitutional: Negative for fever, chills, diaphoresis, activity change, appetite change and fatigue.  Psychiatric/Behavioral:       Memory Loss is present       Objective:   Physical Exam  Constitutional: She is oriented to person, place, and time. She appears well-developed and well-nourished.  Neurological: She is alert and oriented to person, place, and time.  Psychiatric: She has a normal mood and affect. Her behavior is normal. Judgment and thought content normal.    BP 134/72 mmHg  Pulse 68  Temp(Src) 97.6 F (36.4 C) (Oral)  Resp 16  Wt 227 lb (102.967 kg)      Assessment & Plan:  1. Mild cognitive impairment with memory loss Not improved on  Donepezil.  Will hold off on increase until see Neurology for definitive diagnosis.  Explained plan to husband, daughter and patient.   - Ambulatory referral to Neurology   Patient was seen and examined by Jerrell Belfast, MD, and note scribed by Renaldo Fiddler, CMA. I have reviewed the document for accuracy and completeness and I agree with above. Jerrell Belfast, MD   Margarita Rana, MD

## 2015-07-19 ENCOUNTER — Other Ambulatory Visit: Payer: Self-pay | Admitting: Neurology

## 2015-07-19 DIAGNOSIS — G309 Alzheimer's disease, unspecified: Principal | ICD-10-CM

## 2015-07-19 DIAGNOSIS — F0280 Dementia in other diseases classified elsewhere without behavioral disturbance: Secondary | ICD-10-CM

## 2015-07-21 DIAGNOSIS — G301 Alzheimer's disease with late onset: Secondary | ICD-10-CM | POA: Insufficient documentation

## 2015-07-21 DIAGNOSIS — F0280 Dementia in other diseases classified elsewhere without behavioral disturbance: Secondary | ICD-10-CM | POA: Insufficient documentation

## 2015-08-02 ENCOUNTER — Encounter: Payer: Self-pay | Admitting: Family Medicine

## 2015-08-02 ENCOUNTER — Ambulatory Visit (INDEPENDENT_AMBULATORY_CARE_PROVIDER_SITE_OTHER): Payer: Medicare Other | Admitting: Family Medicine

## 2015-08-02 DIAGNOSIS — Z0000 Encounter for general adult medical examination without abnormal findings: Secondary | ICD-10-CM

## 2015-08-02 NOTE — Progress Notes (Signed)
Patient ID: Kathryn Brooks, female   DOB: August 08, 1935, 80 y.o.   MRN: [id number]       Patient: Kathryn Brooks, Female    DOB: 05-Sep-1935, 80 y.o.   MRN: [id number] Visit Date: 08/02/2015  Review of Systems    Social History   Social History  . Marital Status: Married    Spouse Name: N/A  . Number of Children: N/A  . Years of Education: N/A   Occupational History  . Not on file.   Social History Main Topics  . Smoking status: Never Smoker   . Smokeless tobacco: Never Used  . Alcohol Use: No  . Drug Use: No  . Sexual Activity: Not on file   Other Topics Concern  . Not on file   Social History Narrative    Past Medical History  Diagnosis Date  . Barrett's esophagus [id number]    biopsies were completed at 34,32,30,and 26 cm from the incisors. gastric cardia type mucosa was identified from 30-34 cm. Squamocolumnar mucosa with reflux gastroesophagitis and Barrett's esophagus was noted at 26 cm. No atypia or dysplasia was identified. The previous biopsies of December 18, 2010 at 25,30,35 cm showed similar changes. There has been no progression of the Barrett's(long segment) and  . Blood in stool [id number]  . Breast screening, unspecified 2013  . Special screening for malignant neoplasms, colon 2013  . Obesity, unspecified 2013  . GERD (gastroesophageal reflux disease) [id number]  . Thyroid disease   . Barrett's esophagus determined by biopsy 2012    No atypia on March 2014 biopsy.     Patient Active Problem List   Diagnosis Date Noted  . Mild cognitive impairment with memory loss 05/10/2015  . Allergic rhinitis 05/05/2015  . Adult BMI 30+ 05/05/2015  . Fatigue 05/05/2015  . Acid reflux 05/05/2015  . Fecal occult blood test positive 05/05/2015  . Hypertriglyceridemia 05/05/2015  . Breath shortness 05/05/2015  . Pain in shoulder 05/05/2015  . Skin lesion 05/05/2015  . Lump in thyroid 05/05/2015  . Avitaminosis D 05/05/2015  . Gastro-esophageal reflux disease without  esophagitis 05/05/2015  . Barrett's esophagus     Past Surgical History  Procedure Laterality Date  . Tubal ligation    . Upper gastrointestinal endoscopy N/A 2012    chronic grastitis, Barrett's esophagus  . Colonoscopy N/A [id number]    heme positive stools  . Thyroidectomy Right 2015  . Esophagogastroduodenoscopy N/A 09/28/2014    Procedure: ESOPHAGOGASTRODUODENOSCOPY (EGD);  Surgeon: Robert Bellow, MD;  Location: [facility] ENDOSCOPY;  Service: Endoscopy;  Laterality: N/A;    Her family history includes Cancer in her mother; Colon cancer in her mother.    Previous Medications   ASPIRIN 81 MG TABLET    Take 81 mg by mouth daily.   DONEPEZIL (ARICEPT) 5 MG TABLET    Take 1 tablet (5 mg total) by mouth at bedtime.   LEVOTHYROXINE (SYNTHROID, LEVOTHROID) 25 MCG TABLET    Take 12.5 mcg by mouth daily before breakfast. Take with 28mcg tab   LEVOTHYROXINE (SYNTHROID, LEVOTHROID) 75 MCG TABLET    Take 75 mcg by mouth daily before breakfast. With 12.71mcg   OMEPRAZOLE (PRILOSEC) 40 MG CAPSULE    Take 1 capsule by mouth daily as needed. Acid reflux   VITAMIN B-12 (CYANOCOBALAMIN) 1000 MCG TABLET    Take 1,000 mcg by mouth daily.    Patient Care Team: Margarita Rana, MD as PCP - General (Family Medicine) Robert Bellow, MD (General Surgery)  Objective:   Vitals: There were no vitals taken for this visit.  Physical Exam  Activities of Daily Living No flowsheet data found.  Fall Risk Assessment No flowsheet data found.   Depression Screen No flowsheet data found.    Assessment & Plan:     Patient left before being seen as I was running behind. Apologized and offered other visit. Husband very upset.  Also left phone message.   Margarita Rana, MD  ------------------------------------------------------------------------------------------------------------

## 2015-08-09 ENCOUNTER — Ambulatory Visit
Admission: RE | Admit: 2015-08-09 | Discharge: 2015-08-09 | Disposition: A | Discharge status: Home / Self Care | Payer: Medicare Other | Source: Ambulatory Visit | Attending: Neurology | Admitting: Neurology

## 2015-08-09 DIAGNOSIS — G309 Alzheimer's disease, unspecified: Secondary | ICD-10-CM | POA: Diagnosis not present

## 2015-08-09 DIAGNOSIS — G319 Degenerative disease of nervous system, unspecified: Secondary | ICD-10-CM | POA: Diagnosis not present

## 2015-08-09 DIAGNOSIS — F0280 Dementia in other diseases classified elsewhere without behavioral disturbance: Secondary | ICD-10-CM | POA: Diagnosis present

## 2015-08-09 LAB — POCT I-STAT CREATININE: Creatinine, Ser: 1 mg/dL (ref 0.44–1.00)

## 2015-08-09 MED ORDER — GADOBENATE DIMEGLUMINE 529 MG/ML IV SOLN
20.0000 mL | Freq: Once | INTRAVENOUS | Status: AC | PRN
  Administered 2015-08-09: 20 mL via INTRAVENOUS

## 2015-10-06 ENCOUNTER — Ambulatory Visit (INDEPENDENT_AMBULATORY_CARE_PROVIDER_SITE_OTHER): Payer: Medicare Other | Admitting: Family Medicine

## 2015-10-06 ENCOUNTER — Encounter: Payer: Self-pay | Admitting: Family Medicine

## 2015-10-06 VITALS — BP 120/72 | HR 60 | Temp 97.6°F | Resp 16 | Ht 68.0 in | Wt 224.0 lb

## 2015-10-06 DIAGNOSIS — G3184 Mild cognitive impairment, so stated: Secondary | ICD-10-CM | POA: Diagnosis not present

## 2015-10-06 DIAGNOSIS — E079 Disorder of thyroid, unspecified: Secondary | ICD-10-CM | POA: Diagnosis not present

## 2015-10-06 DIAGNOSIS — Z0000 Encounter for general adult medical examination without abnormal findings: Secondary | ICD-10-CM

## 2015-10-06 DIAGNOSIS — R0602 Shortness of breath: Secondary | ICD-10-CM

## 2015-10-06 DIAGNOSIS — Z1239 Encounter for other screening for malignant neoplasm of breast: Secondary | ICD-10-CM | POA: Diagnosis not present

## 2015-10-06 DIAGNOSIS — E781 Pure hyperglyceridemia: Secondary | ICD-10-CM | POA: Diagnosis not present

## 2015-10-06 NOTE — Progress Notes (Signed)
Patient ID: [name], female   DOB: 1936/04/20, 80 y.o.   MRN: [id number]       Patient: Kathryn Brooks, Female    DOB: 1935-07-15, 80 y.o.   MRN: [id number] Visit Date: 10/06/2015  Kathryn Brooks: Margarita Rana, MD   Chief Complaint  Patient presents with  . Medicare Wellness   Subjective:    Annual wellness visit [name] is a 80 y.o. female. She feels well. She reports exercising active with daily activities. She reports she is sleeping well.  08/01/14 AWE 11/30/10 Pap-neg 06/03/12 Mammogram-BI-RADS 1 08/11/14 BMD-Normal 12/18/10 Colonoscopy-Friable -----------------------------------------------------------   Review of Systems  Constitutional: Positive for fatigue.  HENT: Positive for rhinorrhea.   Eyes: Negative.   Respiratory: Positive for shortness of breath.   Cardiovascular: Negative.   Gastrointestinal: Negative.   Endocrine: Negative.   Genitourinary: Negative.   Musculoskeletal: Positive for back pain.  Skin: Negative.   Allergic/Immunologic: Negative.   Neurological: Negative.   Hematological: Negative.   Psychiatric/Behavioral: Negative.     Social History   Social History  . Marital Status: Married    Spouse Name: N/A  . Number of Children: N/A  . Years of Education: N/A   Occupational History  . Not on file.   Social History Main Topics  . Smoking status: Never Smoker   . Smokeless tobacco: Never Used  . Alcohol Use: No  . Drug Use: No  . Sexual Activity: Not on file   Other Topics Concern  . Not on file   Social History Narrative    Past Medical History  Diagnosis Date  . Barrett's esophagus [id number]    biopsies were completed at 34,32,30,and 26 cm from the incisors. gastric cardia type mucosa was identified from 30-34 cm. Squamocolumnar mucosa with reflux gastroesophagitis and Barrett's esophagus was noted at 26 cm. No atypia or dysplasia was identified. The previous biopsies of December 18, 2010 at 25,30,35 cm  showed similar changes. There has been no progression of the Barrett's(long segment) and  . Blood in stool [id number]  . Breast screening, unspecified 2013  . Special screening for malignant neoplasms, colon 2013  . Obesity, unspecified 2013  . GERD (gastroesophageal reflux disease) [id number]  . Thyroid disease   . Barrett's esophagus determined by biopsy 2012    No atypia on March 2014 biopsy.     Patient Active Problem List   Diagnosis Date Noted  . Mild cognitive impairment with memory loss 05/10/2015  . Allergic rhinitis 05/05/2015  . Adult BMI 30+ 05/05/2015  . Fatigue 05/05/2015  . Acid reflux 05/05/2015  . Fecal occult blood test positive 05/05/2015  . Hypertriglyceridemia 05/05/2015  . Breath shortness 05/05/2015  . Pain in shoulder 05/05/2015  . Skin lesion 05/05/2015  . Lump in thyroid 05/05/2015  . Avitaminosis D 05/05/2015  . Gastro-esophageal reflux disease without esophagitis 05/05/2015  . Barrett's esophagus     Past Surgical History  Procedure Laterality Date  . Tubal ligation    . Upper gastrointestinal endoscopy N/A 2012    chronic grastitis, Barrett's esophagus  . Colonoscopy N/A [id number]    heme positive stools  . Thyroidectomy Right 2015  . Esophagogastroduodenoscopy N/A 09/28/2014    Procedure: ESOPHAGOGASTRODUODENOSCOPY (EGD);  Surgeon: Robert Bellow, MD;  Location: [facility] ENDOSCOPY;  Service: Endoscopy;  Laterality: N/A;    Her family history includes Cancer in her mother; Colon cancer in her mother.    Previous Medications   ASPIRIN 81 MG TABLET    Take 81  mg by mouth daily.   CHOLECALCIFEROL (VITAMIN D) 1000 UNITS TABLET    Take 1,000 Units by mouth daily.   DONEPEZIL (ARICEPT) 5 MG TABLET    Take 1 tablet (5 mg total) by mouth at bedtime.   LEVOTHYROXINE (SYNTHROID, LEVOTHROID) 25 MCG TABLET    Take 12.5 mcg by mouth daily before breakfast. Take with 53mcg tab   LEVOTHYROXINE (SYNTHROID, LEVOTHROID) 75 MCG TABLET    Take 75 mcg by mouth daily  before breakfast. With 12.4mcg   OMEPRAZOLE (PRILOSEC) 40 MG CAPSULE    Take 1 capsule by mouth daily as needed. Acid reflux   VITAMIN B-12 (CYANOCOBALAMIN) 1000 MCG TABLET    Take 1,000 mcg by mouth daily.    Patient Care Team: Margarita Rana, MD as PCP - General (Family Medicine) Robert Bellow, MD (General Surgery)     Objective:   Vitals: BP 120/72 mmHg  Pulse 60  Temp(Src) 97.6 F (36.4 C) (Oral)  Resp 16  Ht 5\' 8"  (1.727 m)  Wt 224 lb (101.606 kg)  BMI 34.07 kg/m2  SpO2 97%  Physical Exam  Constitutional: She is oriented to person, place, and time. She appears well-developed and well-nourished.  HENT:  Head: Normocephalic and atraumatic.  Right Ear: Tympanic membrane, external ear and ear canal normal.  Left Ear: Tympanic membrane, external ear and ear canal normal.  Nose: Nose normal.  Mouth/Throat: Uvula is midline, oropharynx is clear and moist and mucous membranes are normal.  Eyes: Conjunctivae, EOM and lids are normal. Pupils are equal, round, and reactive to light.  Neck: Trachea normal and normal range of motion. Neck supple. Carotid bruit is not present. No thyroid mass and no thyromegaly present.  Cardiovascular: Normal rate, regular rhythm and normal heart sounds.   Pulmonary/Chest: Effort normal and breath sounds normal.  Abdominal: Soft. Normal appearance and bowel sounds are normal. There is no hepatosplenomegaly. There is no tenderness.  Musculoskeletal: Normal range of motion.  Lymphadenopathy:    She has no cervical adenopathy.    She has no axillary adenopathy.  Neurological: She is alert and oriented to person, place, and time. She has normal strength. No cranial nerve deficit.  Skin: Skin is warm, dry and intact.  Psychiatric: She has a normal mood and affect. Her speech is normal and behavior is normal. Judgment and thought content normal. Cognition and memory are normal.    Activities of Daily Living In your present state of health, do you  have any difficulty performing the following activities: 10/06/2015  Hearing? N  Vision? N  Difficulty concentrating or making decisions? N  Walking or climbing stairs? Y  Dressing or bathing? N  Doing errands, shopping? N    Fall Risk Assessment Fall Risk  10/06/2015  Falls in the past year? Yes  Number falls in past yr: 1     Depression Screen PHQ 2/9 Scores 10/06/2015  PHQ - 2 Score 0    Cognitive Testing - 6-CIT  Correct? Score   What year is it? yes 0 0 or 4  What month is it? yes 0 0 or 3  Memorize:    Pia Mau,  44,  [place],      What time is it? (within 1 hour) yes 0 0 or 3  Count backwards from 20 yes 0 0, 2, or 4  Name the months of the year no 4 0, 2, or 4  Repeat name & address above no 4 0, 2, 4, 6,  8, or 10       TOTAL SCORE  8/28   Interpretation:  Abnormal- 8  Normal (0-7) Abnormal (8-28)       Assessment & Plan:     Annual Wellness Visit  Reviewed patient's Family Medical History Reviewed and updated list of patient's medical providers Assessment of cognitive impairment was done Assessed patient's functional ability Established a written schedule for health screening [place] Completed and Reviewed  Exercise Activities and Dietary recommendations Goals    None      Immunization History  Administered Date(s) Administered  . Influenza, High Dose Seasonal PF 02/09/2015  . Pneumococcal Conjugate-13 08/01/2014  . Pneumococcal Polysaccharide-23 11/30/2010  . Td 06/21/2004      1. Medicare annual wellness visit, subsequent Stable. Patient advised to continue eating healthy and exercise daily. Patient will establish and follow-up Dr. Caryn Section in 6 months.  2. Mild cognitive impairment with memory loss Stable. Patient advised to keep follow-up with Dr. Manuella Ghazi in 3 months. - Comprehensive metabolic panel - CBC with Differential/Platelet - TSH  3. Hypertriglyceridemia - Comprehensive metabolic panel -  Lipid Panel With LDL/HDL Ratio  4. Lump in thyroid - TSH  5. Shortness of breath Stable.  6. Breast cancer screening - MM DIGITAL SCREENING BILATERAL; Future    Patient seen and examined by Dr. Jerrell Belfast, and note scribed by Philbert Riser. Dimas, CMA.  I have reviewed the document for accuracy and completeness and I agree with above. Jerrell Belfast, MD   Margarita Rana, MD     ------------------------------------------------------------------------------------------------------------

## 2015-10-06 NOTE — Patient Instructions (Signed)
Please call the [facility] at [facility] to schedule this at [phone]   

## 2016-03-26 ENCOUNTER — Encounter: Payer: Self-pay | Admitting: Family Medicine

## 2016-03-26 ENCOUNTER — Ambulatory Visit (INDEPENDENT_AMBULATORY_CARE_PROVIDER_SITE_OTHER): Payer: Medicare Other | Admitting: Family Medicine

## 2016-03-26 VITALS — BP 138/72 | HR 62 | Temp 97.5°F | Resp 16 | Wt 230.0 lb

## 2016-03-26 DIAGNOSIS — E079 Disorder of thyroid, unspecified: Secondary | ICD-10-CM

## 2016-03-26 DIAGNOSIS — K219 Gastro-esophageal reflux disease without esophagitis: Secondary | ICD-10-CM

## 2016-03-26 DIAGNOSIS — G309 Alzheimer's disease, unspecified: Secondary | ICD-10-CM | POA: Diagnosis not present

## 2016-03-26 DIAGNOSIS — F0280 Dementia in other diseases classified elsewhere without behavioral disturbance: Secondary | ICD-10-CM | POA: Diagnosis not present

## 2016-03-26 MED ORDER — MEMANTINE HCL ER 7 & 14 & 21 &28 MG PO CP24: ORAL_CAPSULE | ORAL | 1 refills | Status: DC

## 2016-03-26 MED ORDER — MEMANTINE HCL ER 7 & 14 & 21 &28 MG PO CP24: ORAL_CAPSULE | ORAL | 0 refills | Status: DC

## 2016-03-26 NOTE — Progress Notes (Signed)
Patient: Kathryn Brooks Female    DOB: [birth year]   80 y.o.   MRN: [id number] Visit Date: 03/26/2016  Today's Provider: Lelon Huh, MD   Chief Complaint  Patient presents with  . Memory Loss    follow up  . Hyperlipidemia    follow up  . Thyroid Problem    follow up   Subjective:    HPI  This is a previous patient of Dr. Venia Minks present today as new patient to me to establish care and follow up on chronic medical problems.   Follow up Mild Cognitive impairment with memory loss:  Patient was last seen for this problem 5 months ago and no changes were made. Patient was to keep follow up appointment with Dr. Manuella Ghazi. Patient was seen by Dr. Brigitte Pulse 10/2015 and Aricept was continued at 10mg  daily.   Follow up of Lump in Thyroid: Patient was last seen by Dr. Venia Minks 5 months ago for this problem. Labs were ordered but not completed. She is now being followed by Dr. Marella Bile who is monitoring thyroid functions.    Lipid/Cholesterol, Follow-up:   Last seen for this 5 months ago.  Management changes since that visit include none. . Last Lipid Panel:    Component Value Date/Time   CHOL 215 (A) 08/10/2014   TRIG 149 08/10/2014   HDL 58 08/10/2014   Holly 127 08/10/2014    Risk factors for vascular disease include hypercholesterolemia  She reports good compliance with treatment. She is not having side effects.  Current symptoms include none and have been stable. Weight trend: increasing steadily Prior visit with dietician: no Current diet: well balanced Current exercise: none  Wt Readings from Last 3 Encounters:  10/06/15 224 lb (101.6 kg)  06/12/15 227 lb (103 kg)  05/10/15 226 lb (102.5 kg)    -------------------------------------------------------------------     No Known Allergies   Current Outpatient Prescriptions:  .  aspirin 81 MG tablet, Take 81 mg by mouth daily., Disp: , Rfl:  .  cholecalciferol (VITAMIN D) 1000 units tablet, Take 1,000 Units by  mouth daily., Disp: , Rfl:  .  donepezil (ARICEPT) 5 MG tablet, Take 1 tablet (5 mg total) by mouth at bedtime. (Patient taking differently: Take 10 mg by mouth at bedtime. ), Disp: 30 tablet, Rfl: 5 .  levothyroxine (SYNTHROID, LEVOTHROID) 25 MCG tablet, Take 12.5 mcg by mouth daily before breakfast. Take with 66mcg tab, Disp: , Rfl: 0 .  levothyroxine (SYNTHROID, LEVOTHROID) 75 MCG tablet, Take 75 mcg by mouth daily before breakfast. With 12.55mcg, Disp: , Rfl: 3 .  omeprazole (PRILOSEC) 40 MG capsule, Take 1 capsule by mouth daily as needed. Acid reflux, Disp: , Rfl: 0 .  vitamin B-12 (CYANOCOBALAMIN) 1000 MCG tablet, Take 1,000 mcg by mouth daily., Disp: , Rfl:   Review of Systems  Constitutional: Negative for appetite change, chills, fatigue and fever.  Respiratory: Positive for shortness of breath (with activity). Negative for chest tightness.   Cardiovascular: Negative for chest pain and palpitations.  Gastrointestinal: Negative for abdominal pain, nausea and vomiting.  Endocrine: Negative for cold intolerance, heat intolerance, polydipsia and polyuria.  Neurological: Negative for dizziness and weakness.    Social History  Substance Use Topics  . Smoking status: Never Smoker  . Smokeless tobacco: Never Used  . Alcohol use No   Objective:   BP 138/72 (BP Location: Left Arm, Patient Position: Sitting, Cuff Size: Large)   Pulse 62   Temp 97.5 F (  36.4 C) (Oral)   Resp 16   Wt 230 lb (104.3 kg)   SpO2 97% Comment: room air  BMI 34.97 kg/m   Physical Exam   General Appearance:    Alert, cooperative, no distress  Eyes:    PERRL, conjunctiva/corneas clear, EOM's intact       Lungs:     Clear to auscultation bilaterally, respirations unlabored  Heart:    Regular rate and rhythm  Neurologic:   Awake, alert, oriented x 2. No apparent focal neurological           defect.            Assessment & Plan:     1. Alzheimer's dementia without behavioral disturbance, unspecified  timing of dementia onset No improvement with increase dose of Aricept. Try adding namenda. Call for rx after a month if effective.  - Memantine HCl ER 7 & 14 & 21 &28 MG CP24; Take one 7mg  tablet daily for 7 days, then 14mg  for seven days, then 21mg  for 7 days then 28mg  daily  Dispense: 28 capsule; Refill: 1  2. Gastroesophageal reflux disease, esophagitis presence not specified Well controlled. Continue OTC Priolosec  3. Lump in thyroid Stable, continue follow up with Dr. Pryor Ochoa.        Lelon Huh, MD  [facility]

## 2016-06-05 ENCOUNTER — Emergency Department: Payer: Medicare Other

## 2016-06-05 ENCOUNTER — Emergency Department
Admission: EM | Admit: 2016-06-05 | Discharge: 2016-06-05 | Disposition: A | Discharge status: Home / Self Care | Payer: Medicare Other | Attending: Emergency Medicine | Admitting: Emergency Medicine

## 2016-06-05 DIAGNOSIS — Z7982 Long term (current) use of aspirin: Secondary | ICD-10-CM | POA: Diagnosis not present

## 2016-06-05 DIAGNOSIS — J09X2 Influenza due to identified novel influenza A virus with other respiratory manifestations: Secondary | ICD-10-CM | POA: Diagnosis not present

## 2016-06-05 DIAGNOSIS — R519 Headache, unspecified: Secondary | ICD-10-CM

## 2016-06-05 DIAGNOSIS — R51 Headache: Secondary | ICD-10-CM | POA: Diagnosis present

## 2016-06-05 DIAGNOSIS — J101 Influenza due to other identified influenza virus with other respiratory manifestations: Secondary | ICD-10-CM

## 2016-06-05 LAB — BASIC METABOLIC PANEL
Anion gap: 8 (ref 5–15)
BUN: 15 mg/dL (ref 6–20)
CHLORIDE: 105 mmol/L (ref 101–111)
CO2: 26 mmol/L (ref 22–32)
Calcium: 8.9 mg/dL (ref 8.9–10.3)
Creatinine, Ser: 1.24 mg/dL — ABNORMAL HIGH (ref 0.44–1.00)
GFR calc Af Amer: 47 mL/min — ABNORMAL LOW (ref >60)
GFR, EST NON AFRICAN AMERICAN: 40 mL/min — AB (ref >60)
GLUCOSE: 89 mg/dL (ref 65–99)
POTASSIUM: 3.9 mmol/L (ref 3.5–5.1)
Sodium: 139 mmol/L (ref 135–145)

## 2016-06-05 LAB — CBC
HCT: 38.6 % (ref 35.0–47.0)
Hemoglobin: 13.4 g/dL (ref 12.0–16.0)
MCH: 30.4 pg (ref 26.0–34.0)
MCHC: 34.8 g/dL (ref 32.0–36.0)
MCV: 87.4 fL (ref 80.0–100.0)
PLATELETS: 223 10*3/uL (ref 150–440)
RBC: 4.42 MIL/uL (ref 3.80–5.20)
RDW: 13.8 % (ref 11.5–14.5)
WBC: 4.5 10*3/uL (ref 3.6–11.0)

## 2016-06-05 LAB — URINALYSIS, COMPLETE (UACMP) WITH MICROSCOPIC
BACTERIA UA: NONE SEEN
BILIRUBIN URINE: NEGATIVE
Glucose, UA: NEGATIVE mg/dL
KETONES UR: NEGATIVE mg/dL
Nitrite: NEGATIVE
PROTEIN: NEGATIVE mg/dL
Specific Gravity, Urine: 1.006 (ref 1.005–1.030)
pH: 6 (ref 5.0–8.0)

## 2016-06-05 LAB — INFLUENZA PANEL BY PCR (TYPE A & B)
INFLAPCR: POSITIVE — AB
Influenza B By PCR: NEGATIVE

## 2016-06-05 LAB — TROPONIN I: Troponin I: <0.03 ng/mL (ref <0.03)

## 2016-06-05 MED ORDER — OSELTAMIVIR PHOSPHATE 75 MG PO CAPS: 75.0000 mg | ORAL_CAPSULE | Freq: Two times a day (BID) | ORAL | 0 refills | Status: AC

## 2016-06-05 MED ORDER — OSELTAMIVIR PHOSPHATE 75 MG PO CAPS
75.0000 mg | ORAL_CAPSULE | Freq: Once | ORAL | Status: AC
  Administered 2016-06-05: 75 mg via ORAL
  Filled 2016-06-05: qty 1

## 2016-06-05 NOTE — ED Provider Notes (Signed)
[facility] Emergency Department Provider Note   ____________________________________________   First MD Initiated Contact with Patient 06/05/16 0522     (approximate)  I have reviewed the triage vital signs and the nursing notes.   HISTORY  Chief Complaint Headache    HPI Kathryn Brooks is a 81 y.o. female who comes into the hospital today with a headache. She reports that she noticed it this morning. The patient reports that she just didn't feel well. Her blood pressure was elevated per EMS. She has a history of dementia. The patient reports her birthday recently passed and she was exposed to one of her grandchildren who have the flu. She reports that this morning because her head wasn't feeling right she wasn't sure if she was may be getting the flu. She didn't take anything for her headache. She reports that she took her regular medicines. She came in mainly because of the headache. She has  no fevers, no runny nose. The patient rates her pain a 3 out of 10 in intensity.Patient also has no shortness of breath, no chest pain and no body aches. She is here for evaluation.   Past Medical History:  Diagnosis Date  . Barrett's esophagus [id number]   biopsies were completed at 34,32,30,and 26 cm from the incisors. gastric cardia type mucosa was identified from 30-34 cm. Squamocolumnar mucosa with reflux gastroesophagitis and Barrett's esophagus was noted at 26 cm. No atypia or dysplasia was identified. The previous biopsies of December 18, 2010 at 25,30,35 cm showed similar changes. There has been no progression of the Barrett's(long segment) and  . Barrett's esophagus determined by biopsy 2012   No atypia on March 2014 biopsy.  . Blood in stool [id number]  . Breast screening, unspecified 2013  . GERD (gastroesophageal reflux disease) [id number]  . Obesity, unspecified 2013  . Special screening for malignant neoplasms, colon 2013  . Thyroid disease     Patient  Active Problem List   Diagnosis Date Noted  . Alzheimer's dementia 05/10/2015  . Allergic rhinitis 05/05/2015  . Adult BMI 30+ 05/05/2015  . Fatigue 05/05/2015  . Acid reflux 05/05/2015  . Fecal occult blood test positive 05/05/2015  . Hypertriglyceridemia 05/05/2015  . Breath shortness 05/05/2015  . Pain in shoulder 05/05/2015  . Skin lesion 05/05/2015  . Lump in thyroid 05/05/2015  . Avitaminosis D 05/05/2015  . Gastro-esophageal reflux disease without esophagitis 05/05/2015  . Barrett's esophagus     Past Surgical History:  Procedure Laterality Date  . COLONOSCOPY N/A 2005,2012   heme positive stools  . ESOPHAGOGASTRODUODENOSCOPY N/A 09/28/2014   Procedure: ESOPHAGOGASTRODUODENOSCOPY (EGD);  Surgeon: Robert Bellow, MD;  Location: [facility] ENDOSCOPY;  Service: Endoscopy;  Laterality: N/A;  . THYROIDECTOMY Right 2015  . TUBAL LIGATION    . UPPER GASTROINTESTINAL ENDOSCOPY N/A 2012   chronic grastitis, Barrett's esophagus    Prior to Admission medications   Medication Sig Start Date End Date Taking? Authorizing Provider  aspirin 81 MG tablet Take 81 mg by mouth daily.    Historical Provider, MD  cholecalciferol (VITAMIN D) 1000 units tablet Take 1,000 Units by mouth daily.    Historical Provider, MD  donepezil (ARICEPT) 5 MG tablet Take 1 tablet (5 mg total) by mouth at bedtime. Patient taking differently: Take 10 mg by mouth at bedtime.  05/10/15   Margarita Rana, MD  levothyroxine (SYNTHROID, LEVOTHROID) 25 MCG tablet Take 25 mcg by mouth daily before breakfast. Take with 72mcg tab 08/21/14   Historical  Provider, MD  levothyroxine (SYNTHROID, LEVOTHROID) 75 MCG tablet Take 37.5 mcg by mouth daily before breakfast. With 12.80mcg 07/19/14   Historical Provider, MD  Memantine HCl ER 7 & 14 & 21 &28 MG CP24 Take one 7mg  tablet daily for 7 days, then 14mg  for seven days, then 21mg  for 7 days then 28mg  daily 03/26/16   Birdie Sons, MD  omeprazole (PRILOSEC) 40 MG capsule Take 1  capsule by mouth daily as needed. Acid reflux 06/13/14   Historical Provider, MD  oseltamivir (TAMIFLU) 75 MG capsule Take 1 capsule (75 mg total) by mouth 2 (two) times daily. 06/05/16 06/10/16  Loney Hering, MD  vitamin B-12 (CYANOCOBALAMIN) 1000 MCG tablet Take 1,000 mcg by mouth daily.    Historical Provider, MD    Allergies Patient has no known allergies.  Family History  Problem Relation Age of Onset  . Colon cancer Mother     07/19/2004  . Cancer Mother     Social History Social History  Substance Use Topics  . Smoking status: Never Smoker  . Smokeless tobacco: Never Used  . Alcohol use No    Review of Systems Constitutional: No fever/chills Eyes: No visual changes. ENT: No sore throat. Cardiovascular: Denies chest pain. Respiratory: Cough Gastrointestinal: No abdominal pain.  No nausea, no vomiting.  No diarrhea.  No constipation. Genitourinary: Negative for dysuria. Musculoskeletal: Negative for back pain. Skin: Negative for rash. Neurological: Headache  10-point ROS otherwise negative.  ____________________________________________   PHYSICAL EXAM:  VITAL SIGNS: ED Triage Vitals  Enc Vitals Group     BP 06/05/16 0529 (!) 172/77     Pulse Rate 06/05/16 0529 69     Resp 06/05/16 0529 20     Temp 06/05/16 0529 98.3 F (36.8 C)     Temp Source 06/05/16 0529 Oral     SpO2 06/05/16 0529 95 %     Weight 06/05/16 0529 230 lb (104.3 kg)     Height 06/05/16 0529 5\' 9"  (1.753 m)     Head Circumference --      Peak Flow --      Pain Score 06/05/16 0530 3     Pain Loc --      Pain Edu? --      Excl. in [facility]? --     Constitutional: Alert and oriented. Well appearing and in Mild distress. Eyes: Conjunctivae are normal. PERRL. EOMI. Head: Atraumatic. Nose: No congestion/rhinnorhea. Mouth/Throat: Mucous membranes are moist.  Oropharynx non-erythematous. Cardiovascular: Normal rate, regular rhythm. Grossly normal heart sounds.  Good peripheral  circulation. Respiratory: Normal respiratory effort.  No retractions. Lungs CTAB. Gastrointestinal: Soft and nontender. No distention. Positive bowel sounds Musculoskeletal: No lower extremity tenderness nor edema.  No joint effusions. Neurologic:  Normal speech and language. Cranial nerves II through XII grossly intact with no focal motor or neuro deficits Skin:  Skin is warm, dry and intact.  Psychiatric: Mood and affect are normal.   ____________________________________________   LABS (all labs ordered are listed, but only abnormal results are displayed)  Labs Reviewed  BASIC METABOLIC PANEL - Abnormal; Notable for the following:       Result Value   Creatinine, Ser 1.24 (*)    GFR calc non Af Amer 40 (*)    GFR calc Af Amer 47 (*)    All other components within normal limits  URINALYSIS, COMPLETE (UACMP) WITH MICROSCOPIC - Abnormal; Notable for the following:    Color, Urine STRAW (*)    APPearance CLEAR (*)  Hgb urine dipstick SMALL (*)    Leukocytes, UA TRACE (*)    Squamous Epithelial / LPF 0-5 (*)    All other components within normal limits  INFLUENZA PANEL BY PCR (TYPE A & B) - Abnormal; Notable for the following:    Influenza A By PCR POSITIVE (*)    All other components within normal limits  CBC  TROPONIN I   ____________________________________________  EKG  ED ECG REPORT I, Loney Hering, the attending physician, personally viewed and interpreted this ECG.   Date: 06/05/2016  EKG Time: 526  Rate: 71  Rhythm: normal sinus rhythm  Axis: normal  Intervals:none  ST&T Change: none  ____________________________________________  RADIOLOGY  CT head ____________________________________________   PROCEDURES  Procedure(s) performed: None  Procedures  Critical Care performed: No  ____________________________________________   INITIAL IMPRESSION / ASSESSMENT AND PLAN / ED COURSE  Pertinent labs & imaging results that were available during  my care of the patient were reviewed by me and considered in my medical decision making (see chart for details).  This is a 81 year old female who comes into the hospital today with a headache. She reports that she was exposed to the flu and she is unsure if it may be causing her symptoms. The patient did not take anything for her headache at home. I'll give the patient dose of Tylenol. I will check the patient's flu screen as well as some other basic labs. I will reassess the patient once I received all of her results. I will also the patient for CT scan of her head to ensure that she doesn't have any other cause of her headache.  Clinical Course as of Jun 06 807  Wed Jun 05, 2016  0802 Normal for age. CT Head Wo Contrast [AW]    Clinical Course User Index [AW] Loney Hering, MD   The patient's headache is improved. She does have influenza. I'll give the patient a dose of Tamiflu and she'll be discharged home to follow-up with her primary care physician. The patient has no further complaints or concerns and her vital signs are unremarkable.  ____________________________________________   FINAL CLINICAL IMPRESSION(S) / ED DIAGNOSES  Final diagnoses:  Influenza A  Acute nonintractable headache, unspecified headache type      NEW MEDICATIONS STARTED DURING THIS VISIT:  New Prescriptions   OSELTAMIVIR (TAMIFLU) 75 MG CAPSULE    Take 1 capsule (75 mg total) by mouth 2 (two) times daily.     Note:  This document was prepared using Dragon voice recognition software and may include unintentional dictation errors.    Loney Hering, MD 06/05/16 [phone]

## 2016-06-05 NOTE — ED Triage Notes (Signed)
Pt presents to ED from home via [facility] with c/o headache and "not feeling well". EMS reports pt woke up this morning at her usual time and realized she didn't feel her normal self. Pt reports daughter has flu; rates headache a 3/10 pain. EMS reported VS:157/85, 98.8 oral, 72 HR, 96% RA.

## 2016-06-05 NOTE — ED Notes (Signed)
Patient transported to CT 

## 2016-09-25 ENCOUNTER — Encounter: Payer: Self-pay | Admitting: Family Medicine

## 2016-09-25 ENCOUNTER — Ambulatory Visit (INDEPENDENT_AMBULATORY_CARE_PROVIDER_SITE_OTHER): Payer: Medicare Other | Admitting: Family Medicine

## 2016-09-25 VITALS — BP 140/70 | HR 59 | Temp 97.7°F | Resp 16 | Ht 69.0 in | Wt 222.0 lb

## 2016-09-25 DIAGNOSIS — G309 Alzheimer's disease, unspecified: Secondary | ICD-10-CM

## 2016-09-25 DIAGNOSIS — E781 Pure hyperglyceridemia: Secondary | ICD-10-CM

## 2016-09-25 DIAGNOSIS — E079 Disorder of thyroid, unspecified: Secondary | ICD-10-CM | POA: Diagnosis not present

## 2016-09-25 DIAGNOSIS — R0609 Other forms of dyspnea: Secondary | ICD-10-CM | POA: Diagnosis not present

## 2016-09-25 DIAGNOSIS — F0280 Dementia in other diseases classified elsewhere without behavioral disturbance: Secondary | ICD-10-CM

## 2016-09-25 DIAGNOSIS — E559 Vitamin D deficiency, unspecified: Secondary | ICD-10-CM

## 2016-09-25 NOTE — Progress Notes (Signed)
Patient: Kathryn Brooks Female    DOB: March 13, 1936   81 y.o.   MRN: [id number] Visit Date: 09/25/2016  Today's Provider: Lelon Huh, MD   Chief Complaint  Patient presents with  . Follow-up  . Gastroesophageal Reflux  . Hyperlipidemia   Subjective:    HPI    Lipid/Cholesterol, Follow-up:   Last seen for this 11 months ago.  Management since that visit includes; no changes.  Last Lipid Panel:    Component Value Date/Time   CHOL 215 (A) 08/10/2014   TRIG 149 08/10/2014   HDL 58 08/10/2014   LDLCALC 127 08/10/2014    She reports good compliance with treatment. She is not having side effects. none  Wt Readings from Last 3 Encounters:  09/25/16 222 lb (100.7 kg)  06/05/16 230 lb (104.3 kg)  03/26/16 230 lb (104.3 kg)    ----------------------------------------------------------------  Alzheimer's dementia without behavioral disturbance, unspecified timing of dementia onset Prescribed Nemenda at her last visit in November, but she and her husbance both think it did not help with her memory. She did not have any adverse effects, but stopped after finishing starter titration pack. Had follow up with Dr. Manuella Ghazi in December and no further changes were made and was advised to return in June 2018.   Gastroesophageal reflux disease, esophagitis presence not specified Last seen 11/14 2017 and well controlled on omeprazole. Last endoscopy with Dr. Fleet Contras was in 2016.   Follow up hypothyroidism. Doing well on current dose of levothyroxine.  Lab Results  Component Value Date   TSH 2.060 05/10/2015  Continues regular follow up with Dr. Pryor Ochoa.   Only complaint today is that she gets short of breath with exertion, which has  Been going on for a few years, but slowly getting worse the last several months.    No Known Allergies   Current Outpatient Prescriptions:  .  aspirin 81 MG tablet, Take 81 mg by mouth daily., Disp: , Rfl:  .  cholecalciferol (VITAMIN D)  1000 units tablet, Take 1,000 Units by mouth daily., Disp: , Rfl:  .  donepezil (ARICEPT) 5 MG tablet, Take 1 tablet (5 mg total) by mouth at bedtime. (Patient taking differently: Take 10 mg by mouth at bedtime. ), Disp: 30 tablet, Rfl: 5 .  levothyroxine (SYNTHROID, LEVOTHROID) 25 MCG tablet, Take 25 mcg by mouth daily before breakfast. Take with 75mcg tab, Disp: , Rfl: 0 .  levothyroxine (SYNTHROID, LEVOTHROID) 75 MCG tablet, Take 37.5 mcg by mouth daily before breakfast. With 12.38mcg, Disp: , Rfl: 3 .  omeprazole (PRILOSEC) 40 MG capsule, Take 1 capsule by mouth daily as needed. Acid reflux, Disp: , Rfl: 0 .  vitamin B-12 (CYANOCOBALAMIN) 1000 MCG tablet, Take 1,000 mcg by mouth daily., Disp: , Rfl:  .  Memantine HCl ER 7 & 14 & 21 &28 MG CP24, Take one 7mg  tablet daily for 7 days, then 14mg  for seven days, then 21mg  for 7 days then 28mg  daily (Patient not taking: Reported on 09/25/2016), Disp: 28 capsule, Rfl: 1  Review of Systems  Constitutional: Negative for appetite change, chills, fatigue and fever.  Respiratory: Negative for chest tightness and shortness of breath.   Cardiovascular: Negative for chest pain and palpitations.  Gastrointestinal: Negative for abdominal pain, nausea and vomiting.  Neurological: Negative for dizziness and weakness.    Social History  Substance Use Topics  . Smoking status: Never Smoker  . Smokeless tobacco: Never Used  . Alcohol use No  Objective:   BP 140/70 (BP Location: Right Arm, Patient Position: Sitting, Cuff Size: Large)   Pulse (!) 59   Temp 97.7 F (36.5 C) (Oral)   Resp 16   Ht 5\' 9"  (1.753 m)   Wt 222 lb (100.7 kg)   SpO2 96%   BMI 32.78 kg/m  Vitals:   09/25/16 0811  BP: 140/70  Pulse: (!) 59  Resp: 16  Temp: 97.7 F (36.5 C)  TempSrc: Oral  SpO2: 96%  Weight: 222 lb (100.7 kg)  Height: 5\' 9"  (1.753 m)     Physical Exam   General Appearance:    Alert, cooperative, no distress  Eyes:    PERRL, conjunctiva/corneas  clear, EOM's intact       Lungs:     Clear to auscultation bilaterally, respirations unlabored  Heart:    Regular rate and rhythm  Neurologic:   Awake, alert, oriented x 2. No apparent focal neurological           defect.           Assessment & Plan:      1. Dyspnea on exertion  - CBC - Brain natriuretic peptide  2. Avitaminosis D  - VITAMIN D 25 Hydroxy (Vit-D Deficiency, Fractures)  3. Hypertriglyceridemia  - Lipid panel  4. Alzheimer's dementia without behavioral disturbance, unspecified timing of dementia onset Stable. Continue donepezil and regular follow up with Dr. Manuella Ghazi.        Lelon Huh, MD  [facility]

## 2016-09-26 ENCOUNTER — Ambulatory Visit (INDEPENDENT_AMBULATORY_CARE_PROVIDER_SITE_OTHER): Payer: Medicare Other | Admitting: General Surgery

## 2016-09-26 ENCOUNTER — Encounter: Payer: Self-pay | Admitting: General Surgery

## 2016-09-26 VITALS — BP 128/82 | HR 66 | Resp 12 | Ht 69.0 in | Wt 222.0 lb

## 2016-09-26 DIAGNOSIS — K22710 Barrett's esophagus with low grade dysplasia: Secondary | ICD-10-CM

## 2016-09-26 LAB — LIPID PANEL
CHOLESTEROL TOTAL: 207 mg/dL — AB (ref 100–199)
Chol/HDL Ratio: 3.6 ratio (ref 0.0–4.4)
HDL: 58 mg/dL (ref >39)
LDL CALC: 124 mg/dL — AB (ref 0–99)
Triglycerides: 123 mg/dL (ref 0–149)
VLDL CHOLESTEROL CAL: 25 mg/dL (ref 5–40)

## 2016-09-26 LAB — CBC
HEMOGLOBIN: 13.4 g/dL (ref 11.1–15.9)
Hematocrit: 41.7 % (ref 34.0–46.6)
MCH: 29.3 pg (ref 26.6–33.0)
MCHC: 32.1 g/dL (ref 31.5–35.7)
MCV: 91 fL (ref 79–97)
Platelets: 222 10*3/uL (ref 150–379)
RBC: 4.58 x10E6/uL (ref 3.77–5.28)
RDW: 13.6 % (ref 12.3–15.4)
WBC: 5.1 10*3/uL (ref 3.4–10.8)

## 2016-09-26 LAB — VITAMIN D 25 HYDROXY (VIT D DEFICIENCY, FRACTURES): VIT D 25 HYDROXY: 38 ng/mL (ref 30.0–100.0)

## 2016-09-26 LAB — BRAIN NATRIURETIC PEPTIDE: BNP: 55 pg/mL (ref 0.0–100.0)

## 2016-09-26 NOTE — Patient Instructions (Signed)
Upper Endoscopy Upper endoscopy is a procedure to look inside the upper GI (gastrointestinal) tract. The upper GI tract is made up of:  The tube that carries food and liquid from your throat to your stomach (esophagus).  The stomach.  The first part of your small intestine (duodenum).  This procedure is also called esophagogastroduodenoscopy (EGD) or gastroscopy. In this procedure, your health care provider passes a thin, flexible tube (endoscope) through your mouth and down your esophagus into your stomach. A small camera is attached to the end of the tube. Images from the camera appear on a monitor in the exam room. During this procedure, your health care provider may also remove a small piece of tissue to be sent to a lab and examined under a microscope (biopsy). Your health care provider may do an upper endoscopy to diagnose cancers of the upper GI tract. You may also have this procedure to find the cause of other conditions, such as:  Stomach pain.  Heartburn.  Pain or problems when swallowing.  Nausea and vomiting.  Stomach bleeding.  Stomach ulcers.  Tell a health care provider about:  Any allergies you have.  All medicines you are taking, including vitamins, herbs, eye drops, creams, and over-the-counter medicines.  Any problems you or family members have had with anesthetic medicines.  Any blood disorders you have.  Any surgeries you have had.  Any medical conditions you have.  Whether you are pregnant or may be pregnant. What are the risks? Generally, this is a safe procedure. However, problems may occur, including:  Infection.  Bleeding.  Allergic reactions to medicines.  A tear or hole (perforation) in the esophagus, stomach, or duodenum.  What happens before the procedure?  Follow instructions from your health care provider about eating or drinking restrictions.  Ask your health care provider about changing or stopping your regular medicines. This is  especially important if you are taking diabetes medicines or blood thinners.  Plan to have someone take you home after the procedure.  If you go home right after the procedure, plan to have someone with you for 24 hours. What happens during the procedure?  An IV tube will be inserted into one of your veins.  Your throat may be sprayed with medicine that numbs the area (local anesthetic).  You may be given a medicine to help you relax (sedative).  You will lie on your left side.  Your health care provider will pass the endoscope through your mouth and down your esophagus.  Your provider will use the scope to check the inside of your esophagus, stomach, and duodenum. Biopsies may be taken. The procedure may vary among health care providers and hospitals. What happens after the procedure?  Do not drive for 24 hours if you received a sedative.  Your blood pressure, heart rate, breathing rate, and blood oxygen level will be monitored often until the medicines you were given have worn off.  When your throat is no longer numb, you may be given some fluids to drink.  It is your responsibility to get the results of your procedure. Ask your health care provider or the department performing the procedure when your results will be ready. This information is not intended to replace advice given to you by your health care provider. Make sure you discuss any questions you have with your health care provider. Document Released: 04/26/2000 Document Revised: 10/10/2015 Document Reviewed: 02/09/2015 Elsevier Interactive Patient Education  2017 Elsevier Inc.  

## 2016-09-26 NOTE — Progress Notes (Signed)
Patient ID: Kathryn Brooks, female   DOB: 23-Jan-1936, 81 y.o.   MRN: [id number]  Chief Complaint  Patient presents with  . Follow-up    follow up upper endoscopy    HPI Kathryn Brooks is a 81 y.o. female is here today for a 2 year follow up for an Upper Endoscopy. Last endoscopy done 2016. Patient states she is doing well. She states she will have occasional reflux if she misses a does of Prilosec. Her husband Kathryn Brooks is present. HPI  Past Medical History:  Diagnosis Date  . Barrett's esophagus [id number]   biopsies were completed at 34,32,30,and 26 cm from the incisors. gastric cardia type mucosa was identified from 30-34 cm. Squamocolumnar mucosa with reflux gastroesophagitis and Barrett's esophagus was noted at 26 cm. No atypia or dysplasia was identified. The previous biopsies of December 18, 2010 at 25,30,35 cm showed similar changes. There has been no progression of the Barrett's(long segment) and  . Barrett's esophagus determined by biopsy 2012   No atypia on March 2014 biopsy.  . Blood in stool [id number]  . Breast screening, unspecified 2013  . GERD (gastroesophageal reflux disease) [id number]  . Obesity, unspecified 2013  . Special screening for malignant neoplasms, colon 2013  . Thyroid disease     Past Surgical History:  Procedure Laterality Date  . COLONOSCOPY N/A 2005,2012   heme positive stools  . ESOPHAGOGASTRODUODENOSCOPY N/A 09/28/2014   Procedure: ESOPHAGOGASTRODUODENOSCOPY (EGD);  Surgeon: Robert Bellow, MD;  Location: [facility] ENDOSCOPY;  Service: Endoscopy;  Laterality: N/A;  . THYROIDECTOMY Right 2015  . TUBAL LIGATION    . UPPER GASTROINTESTINAL ENDOSCOPY N/A 2012   chronic grastitis, Barrett's esophagus    Family History  Problem Relation Age of Onset  . Colon cancer Mother        07/19/2004  . Cancer Mother     Social History Social History  Substance Use Topics  . Smoking status: Never Smoker  . Smokeless tobacco: Never Used  . Alcohol use No     No Known Allergies  Current Outpatient Prescriptions  Medication Sig Dispense Refill  . aspirin 81 MG tablet Take 81 mg by mouth daily.    . cholecalciferol (VITAMIN D) 1000 units tablet Take 1,000 Units by mouth daily.    Marland Kitchen donepezil (ARICEPT) 5 MG tablet Take 1 tablet (5 mg total) by mouth at bedtime. (Patient taking differently: Take 10 mg by mouth at bedtime. ) 30 tablet 5  . levothyroxine (SYNTHROID, LEVOTHROID) 25 MCG tablet Take 25 mcg by mouth daily before breakfast. Take with 40mcg tab  0  . levothyroxine (SYNTHROID, LEVOTHROID) 75 MCG tablet Take 37.5 mcg by mouth daily before breakfast. With 12.24mcg  3  . omeprazole (PRILOSEC) 40 MG capsule Take 1 capsule by mouth daily as needed. Acid reflux  0  . vitamin B-12 (CYANOCOBALAMIN) 1000 MCG tablet Take by mouth.     No current facility-administered medications for this visit.     Review of Systems Review of Systems  Constitutional: Negative.   Respiratory: Negative.   Cardiovascular: Negative.     Blood pressure 128/82, pulse 66, resp. rate 12, height 5\' 9"  (1.753 m), weight 222 lb (100.7 kg).  Physical Exam Physical Exam  Constitutional: She is oriented to person, place, and time. She appears well-developed and well-nourished.  Eyes: Conjunctivae are normal. No scleral icterus.  Cardiovascular: Normal rate, regular rhythm and normal heart sounds.   Pulmonary/Chest: Effort normal and breath sounds normal.  Neurological: She is alert  and oriented to person, place, and time.  Skin: Skin is warm and dry.    Data Reviewed 07/29/2014 endoscopy results: DIAGNOSIS:  A. ESOPHAGUS AT 37 CM; COLD BIOPSIES:  - BARRETT MUCOSA WITH MILD CHRONIC INFLAMMATION.  - NEGATIVE FOR DYSPLASIA AND MALIGNANCY.   B. ESOPHAGUS AT 35 CM; COLD BIOPSIES:  - BARRETT MUCOSA WITH MILD CHRONIC INFLAMMATION.  - NEGATIVE FOR DYSPLASIA AND MALIGNANCY.   C. ESOPHAGUS AT 25 CM; COLD BIOPSIES:  - BARRETT MUCOSA WITH MILD CHRONIC INFLAMMATION.  -  DETACHED STRATIFIED SQUAMOUS EPITHELIUM WITHOUT INFLAMMATION.  - NEGATIVE FOR DYSPLASIA AND MALIGNANCY.   Assessment    Long segment Barrett's esophagus, repeat endoscopy to assess for disease progression.    Plan    Indications for repeat upper endoscopy were reviewed.    HPI, Physical Exam, Assessment and Plan have been scribed under the direction and in the presence of Hervey Ard, MD.  Verlene Mayer, CMA  I have completed the exam and reviewed the above documentation for accuracy and completeness.  I agree with the above.  Haematologist has been used and any errors in dictation or transcription are unintentional.  Hervey Ard, M.D., F.A.C.S.   Robert Bellow 09/28/2016, 8:24 AM   Patient has been scheduled for an upper endoscopy on 10-30-16 at [facility] It is okay for patient to continue 81 mg aspirin once daily. Upper endoscopy instructions have been reviewed with the patient. This patient is aware to call the office if they have further questions.   Dominga Ferry, CMA

## 2016-10-23 ENCOUNTER — Telehealth: Payer: Self-pay | Admitting: *Deleted

## 2016-10-23 NOTE — Telephone Encounter (Signed)
PATIENT RETURNED CALLED LEFT BY MICHEL EARLIER TODAY.I CONFIRMED WITH PATIENT SHE HAS NO CHANGES IN HER MEDICATIONS OR CHNGE IN HEALTH SINCE HER LAST VISIT.SHE HAD NOT QUESTIONS. CONFIRMED MICHELE DID NOT NEED TO SPEAK WITH PATIENT.

## 2016-10-23 NOTE — Telephone Encounter (Signed)
Message left for patient to call the office.   Patient is scheduled for an upper endoscopy on 10-30-16 with Dr. Bary Castilla. We need to verify that she has not had a medication change or change in her health since her last office visit.

## 2016-10-30 ENCOUNTER — Ambulatory Visit
Admission: RE | Admit: 2016-10-30 | Discharge: 2016-10-30 | Disposition: A | Discharge status: Home / Self Care | Payer: Medicare Other | Source: Ambulatory Visit | Attending: General Surgery | Admitting: General Surgery

## 2016-10-30 ENCOUNTER — Encounter
Admission: RE | Disposition: A | Discharge status: Home / Self Care | Payer: Self-pay | Source: Ambulatory Visit | Attending: General Surgery

## 2016-10-30 ENCOUNTER — Ambulatory Visit: Payer: Medicare Other | Admitting: Certified Registered Nurse Anesthetist

## 2016-10-30 DIAGNOSIS — E079 Disorder of thyroid, unspecified: Secondary | ICD-10-CM | POA: Diagnosis not present

## 2016-10-30 DIAGNOSIS — Z79899 Other long term (current) drug therapy: Secondary | ICD-10-CM | POA: Insufficient documentation

## 2016-10-30 DIAGNOSIS — K219 Gastro-esophageal reflux disease without esophagitis: Secondary | ICD-10-CM | POA: Diagnosis not present

## 2016-10-30 DIAGNOSIS — K22710 Barrett's esophagus with low grade dysplasia: Secondary | ICD-10-CM

## 2016-10-30 DIAGNOSIS — K2270 Barrett's esophagus without dysplasia: Secondary | ICD-10-CM | POA: Diagnosis not present

## 2016-10-30 DIAGNOSIS — Z7982 Long term (current) use of aspirin: Secondary | ICD-10-CM | POA: Diagnosis not present

## 2016-10-30 DIAGNOSIS — K317 Polyp of stomach and duodenum: Secondary | ICD-10-CM | POA: Diagnosis not present

## 2016-10-30 HISTORY — PX: ESOPHAGOGASTRODUODENOSCOPY (EGD) WITH PROPOFOL: SHX5813

## 2016-10-30 SURGERY — ESOPHAGOGASTRODUODENOSCOPY (EGD) WITH PROPOFOL
Anesthesia: General

## 2016-10-30 MED ORDER — PROPOFOL 10 MG/ML IV BOLUS
INTRAVENOUS | Status: DC | PRN
  Administered 2016-10-30: 80 mg via INTRAVENOUS
  Administered 2016-10-30: 30 mg via INTRAVENOUS

## 2016-10-30 MED ORDER — SODIUM CHLORIDE 0.9 % IV SOLN
INTRAVENOUS | Status: DC
  Administered 2016-10-30: 1000 mL via INTRAVENOUS

## 2016-10-30 MED ORDER — PROPOFOL 500 MG/50ML IV EMUL
INTRAVENOUS | Status: AC
  Filled 2016-10-30: qty 50

## 2016-10-30 MED ORDER — PROPOFOL 500 MG/50ML IV EMUL
INTRAVENOUS | Status: DC | PRN
  Administered 2016-10-30: 100 ug/kg/min via INTRAVENOUS

## 2016-10-30 MED ORDER — LIDOCAINE HCL (PF) 2 % IJ SOLN
INTRAMUSCULAR | Status: DC | PRN
  Administered 2016-10-30: 50 mg via INTRADERMAL

## 2016-10-30 NOTE — Anesthesia Preprocedure Evaluation (Signed)
Anesthesia Evaluation  Patient identified by MRN, date of birth, ID band Patient awake    Reviewed: Allergy & Precautions, NPO status , Patient's Chart, lab work & pertinent test results  Airway Mallampati: II       Dental  (+) Upper Dentures, Lower Dentures   Pulmonary neg pulmonary ROS,    breath sounds clear to auscultation       Cardiovascular Exercise Tolerance: Good  Rhythm:Regular     Neuro/Psych Depression negative neurological ROS  negative psych ROS   GI/Hepatic Neg liver ROS, GERD  Medicated,  Endo/Other  negative endocrine ROS  Renal/GU negative Renal ROS     Musculoskeletal negative musculoskeletal ROS (+)   Abdominal (+) + obese,   Peds negative pediatric ROS (+)  Hematology negative hematology ROS (+)   Anesthesia Other Findings   Reproductive/Obstetrics                             Anesthesia Physical Anesthesia Plan  ASA: II  Anesthesia Plan: General   Post-op Pain Management:    Induction: Intravenous  PONV Risk Score and Plan: 0  Airway Management Planned: Natural Airway and Nasal Cannula  Additional Equipment:   Intra-op Plan:   Post-operative Plan:   Informed Consent: I have reviewed the patients History and Physical, chart, labs and discussed the procedure including the risks, benefits and alternatives for the proposed anesthesia with the patient or authorized representative who has indicated his/her understanding and acceptance.     Plan Discussed with: CRNA  Anesthesia Plan Comments:         Anesthesia Quick Evaluation

## 2016-10-30 NOTE — H&P (Signed)
Kathryn Brooks [id number] January 16, 1936     HPI: This otherwise healthy 81 year old woman is been shown have Barrett's esophagus at the time of her most recent endoscopy in May 2016. This extended from 25-37 cm. Mild chronic inflammation without evidence of dysplasia or malignancy. She is seen today for a follow-up endoscopy. He reports feeling well except for being hungry.  Prescriptions Prior to Admission  Medication Sig Dispense Refill Last Dose  . aspirin 81 MG tablet Take 81 mg by mouth daily.   10/29/2016 at Unknown time  . cholecalciferol (VITAMIN D) 1000 units tablet Take 1,000 Units by mouth daily.   10/29/2016 at Unknown time  . donepezil (ARICEPT) 5 MG tablet Take 1 tablet (5 mg total) by mouth at bedtime. (Patient taking differently: Take 10 mg by mouth at bedtime. ) 30 tablet 5 10/29/2016 at Unknown time  . levothyroxine (SYNTHROID, LEVOTHROID) 25 MCG tablet Take 25 mcg by mouth daily before breakfast. Take with 40mcg tab  0 10/29/2016 at Unknown time  . levothyroxine (SYNTHROID, LEVOTHROID) 75 MCG tablet Take 37.5 mcg by mouth daily before breakfast. With 12.110mcg  3 10/29/2016 at Unknown time  . omeprazole (PRILOSEC) 40 MG capsule Take 1 capsule by mouth daily as needed. Acid reflux  0 10/29/2016 at Unknown time  . vitamin B-12 (CYANOCOBALAMIN) 1000 MCG tablet Take by mouth.   Past Week at Unknown time   No Known Allergies Past Medical History:  Diagnosis Date  . Barrett's esophagus [id number]   biopsies were completed at 34,32,30,and 26 cm from the incisors. gastric cardia type mucosa was identified from 30-34 cm. Squamocolumnar mucosa with reflux gastroesophagitis and Barrett's esophagus was noted at 26 cm. No atypia or dysplasia was identified. The previous biopsies of December 18, 2010 at 25,30,35 cm showed similar changes. There has been no progression of the Barrett's(long segment) and  . Barrett's esophagus determined by biopsy 2012   No atypia on March 2014 biopsy.  . Blood in stool  [id number]  . Breast screening, unspecified 2013  . GERD (gastroesophageal reflux disease) [id number]  . Obesity, unspecified 2013  . Special screening for malignant neoplasms, colon 2013  . Thyroid disease    Past Surgical History:  Procedure Laterality Date  . COLONOSCOPY N/A 2005,2012   heme positive stools  . ESOPHAGOGASTRODUODENOSCOPY N/A 09/28/2014   Procedure: ESOPHAGOGASTRODUODENOSCOPY (EGD);  Surgeon: Robert Bellow, MD;  Location: [facility] ENDOSCOPY;  Service: Endoscopy;  Laterality: N/A;  . THYROIDECTOMY Right 2015  . TUBAL LIGATION    . UPPER GASTROINTESTINAL ENDOSCOPY N/A 2012   chronic grastitis, Barrett's esophagus   Social History   Social History  . Marital status: Married    Spouse name: N/A  . Number of children: N/A  . Years of education: N/A   Occupational History  . Not on file.   Social History Main Topics  . Smoking status: Never Smoker  . Smokeless tobacco: Never Used  . Alcohol use No  . Drug use: No  . Sexual activity: Not on file   Other Topics Concern  . Not on file   Social History Narrative  . No narrative on file   Social History   Social History Narrative  . No narrative on file     ROS: Negative.     PE: HEENT: Negative. Lungs: Clear. Cardio: RR.  Assessment/Plan:  Proceed with planned endoscopy.  Robert Bellow 10/30/2016

## 2016-10-30 NOTE — Op Note (Signed)
[facility] Gastroenterology Patient Name: Kathryn Brooks Procedure Date: 10/30/2016 10:19 AM MRN: [id number] Account #: [id number] Date of Birth: 1935-07-21 Admit Type: Outpatient Age: 81 Room: [facility] ENDO ROOM 1 Gender: Female Note Status: Finalized Procedure:            Upper GI endoscopy Indications:          Follow-up of Barrett's esophagus Providers:            Robert Bellow, MD Referring MD:         Kirstie Peri. Caryn Section, MD (Referring MD) Medicines:            Monitored Anesthesia Care Complications:        No immediate complications. Procedure:            Pre-Anesthesia Assessment:                       - Prior to the procedure, a History and Physical was                        performed, and patient medications, allergies and                        sensitivities were reviewed. The patient's tolerance of                        previous anesthesia was reviewed.                       - The risks and benefits of the procedure and the                        sedation options and risks were discussed with the                        patient. All questions were answered and informed                        consent was obtained.                       After obtaining informed consent, the endoscope was                        passed under direct vision. Throughout the procedure,                        the patient's blood pressure, pulse, and oxygen                        saturations were monitored continuously. The Endoscope                        was introduced through the mouth, and advanced to the                        second part of duodenum. The upper GI endoscopy was                        accomplished without difficulty. The patient tolerated  the procedure well. Findings:      The esophagus and gastroesophageal junction were examined with white       light from a forward view and retroflexed position. There were       esophageal mucosal  changes secondary to established long-segment       Barrett's disease. These changes involved the mucosa at the upper extent       of the gastric folds (39 cm from the incisors) extending to the Z-line.       Circumferential salmon-colored mucosa was present from 15 to 37 cm and       circumferential salmon-colored mucosa was present from 24 to 37 cm. The       maximum longitudinal extent of these esophageal mucosal changes was 13       cm in length. Mucosa was biopsied with a cold forceps for histology in 3       sections in the middle third of the esophagus and in the lower third of       the esophagus, at 25, 35 and 37 cm from the incisors and at the       gastroesophageal junction. A total of 3 specimen bottles were sent to       pathology.      Multiple 5 mm sessile polyps with no bleeding and no stigmata of recent       bleeding were found on the greater curvature of the gastric body.       Biopsies were taken with a cold forceps for histology.      The examined duodenum was normal. Impression:           - Esophageal mucosal changes secondary to established                        long-segment Barrett's disease. Biopsied.                       - Multiple gastric polyps. Biopsied.                       - Normal examined duodenum. Recommendation:       - Telephone endoscopist for pathology results in 1 week. Procedure Code(s):    --- Professional ---                       [phone], Esophagogastroduodenoscopy, flexible, transoral;                        with biopsy, single or multiple Diagnosis Code(s):    --- Professional ---                       K22.70, Barrett's esophagus without dysplasia                       K31.7, Polyp of stomach and duodenum CPT copyright 2016 American Medical Association. All rights reserved. The codes documented in this report are preliminary and upon coder review may  be revised to meet current compliance requirements. Robert Bellow, MD 10/30/2016  10:46:40 AM This report has been signed electronically. Number of Addenda: 0 Note Initiated On: 10/30/2016 10:19 AM      [facility]

## 2016-10-30 NOTE — Anesthesia Post-op Follow-up Note (Cosign Needed)
Anesthesia QCDR form completed.        

## 2016-10-30 NOTE — Anesthesia Postprocedure Evaluation (Signed)
Anesthesia Post Note  Patient: Kathryn Brooks  Procedure(s) Performed: Procedure(s) (LRB): ESOPHAGOGASTRODUODENOSCOPY (EGD) WITH PROPOFOL (N/A)  Patient location during evaluation: PACU Anesthesia Type: General Level of consciousness: awake Pain management: pain level controlled Vital Signs Assessment: post-procedure vital signs reviewed and stable Respiratory status: spontaneous breathing Cardiovascular status: stable Anesthetic complications: no     Last Vitals:  Vitals:   10/30/16 1100 10/30/16 1110  BP: (!) 158/99 (!) 162/81  Pulse: 63 (!) 55  Resp: 19 17  Temp:      Last Pain:  Vitals:   10/30/16 1044  TempSrc: Tympanic                 VAN STAVEREN,Redonna Wilbert

## 2016-10-30 NOTE — Transfer of Care (Signed)
Immediate Anesthesia Transfer of Care Note  Patient: Kathryn Brooks  Procedure(s) Performed: Procedure(s): ESOPHAGOGASTRODUODENOSCOPY (EGD) WITH PROPOFOL (N/A)  Patient Location: PACU  Anesthesia Type:General  Level of Consciousness: awake and alert   Airway & Oxygen Therapy: Patient connected to nasal cannula oxygen  Post-op Assessment: Report given to RN and Post -op Vital signs reviewed and stable  Post vital signs: Reviewed and stable  Last Vitals:  Vitals:   10/30/16 1040 10/30/16 1044  BP: 120/64 120/64  Pulse: (!) 55 66  Resp: 20 16  Temp: 36.3 C 36.3 C    Last Pain:  Vitals:   10/30/16 1044  TempSrc: Tympanic         Complications: No apparent anesthesia complications

## 2016-10-31 ENCOUNTER — Encounter: Payer: Self-pay | Admitting: General Surgery

## 2016-11-01 ENCOUNTER — Telehealth: Payer: Self-pay | Admitting: General Surgery

## 2016-11-01 LAB — SURGICAL PATHOLOGY

## 2016-11-01 NOTE — Telephone Encounter (Signed)
Patient notified of benign path results.  Will plan on a recheck in two years based on her health at the time

## 2016-11-14 ENCOUNTER — Encounter: Payer: Self-pay | Admitting: General Surgery

## 2016-11-29 ENCOUNTER — Ambulatory Visit (INDEPENDENT_AMBULATORY_CARE_PROVIDER_SITE_OTHER): Payer: Medicare Other | Admitting: Physician Assistant

## 2016-11-29 ENCOUNTER — Encounter: Payer: Self-pay | Admitting: Physician Assistant

## 2016-11-29 VITALS — BP 110/80 | HR 64 | Temp 97.5°F | Resp 16 | Wt 220.0 lb

## 2016-11-29 DIAGNOSIS — T63301A Toxic effect of unspecified spider venom, accidental (unintentional), initial encounter: Secondary | ICD-10-CM | POA: Diagnosis not present

## 2016-11-29 MED ORDER — CEPHALEXIN 500 MG PO CAPS: 500.0000 mg | ORAL_CAPSULE | Freq: Three times a day (TID) | ORAL | 0 refills | Status: DC

## 2016-11-29 NOTE — Patient Instructions (Signed)
Spider Bite Spider bites are not common. Most spider bites do not cause serious problems. There are only a few types of spider bites that can cause serious health problems. Follow these instructions at home: Medicine  Take or apply over-the-counter and prescription medicines only as told by your doctor.  If you were given an antibiotic medicine, take or apply it as told by your doctor. Do not stop using the antibiotic even if your condition improves. General instructions  Do not scratch the bite area.  Keep the bite area clean and dry. Wash the bite area with soap and water every day as told by your doctor.  If directed, apply ice to the bite area. ? Put ice in a plastic bag. ? Place a towel between your skin and the bag. ? Leave the ice on for 20 minutes, 2-3 times per day.  Raise (elevate) the affected area above the level of your heart while you are sitting or lying down, if this is possible.  Keep all follow-up visits as told by your doctor. This is important. Contact a doctor if:  Your bite does not get better after 3 days.  Your bite turns black or purple.  Near the bite, you have: ? Redness. ? Swelling (inflammation). ? Pain that is getting worse. Get help right away if:  You get shortness of breath or chest pain.  You have fluid, blood, or pus coming from the bite area.  You have muscle cramps or painful muscle spasms.  You have stomach (abdominal) pain.  You feel sick to your stomach (nauseous) or you throw up (vomit).  You feel more tired or sleepy than you normally do. This information is not intended to replace advice given to you by your health care provider. Make sure you discuss any questions you have with your health care provider. Document Released: 06/01/2010 Document Revised: 12/25/2015 Document Reviewed: 09/14/2014 Elsevier Interactive Patient Education  2018 [employer] Inc[employer]  

## 2016-11-29 NOTE — Progress Notes (Signed)
       Patient: Kathryn Brooks Female    DOB: 04-Aug-1935   81 y.o.   MRN: [id number] Visit Date: 11/29/2016  Today's Provider: Mar Daring, PA-C   Chief Complaint  Patient presents with  . Insect Bite   Subjective:    HPI Patient here today C/O of bug bite on left upper leg. Patient reports swelling and itching denies any discharge. Patient reports using OTC anti itch cream. She reports that it did develop a hard lump underneath and became red and swollen. After a day or so it drained some purulent fluid. Now is tender to touch and has a slight hardness centrally.    No Known Allergies   Current Outpatient Prescriptions:  .  aspirin 81 MG tablet, Take 81 mg by mouth daily., Disp: , Rfl:  .  cholecalciferol (VITAMIN D) 1000 units tablet, Take 1,000 Units by mouth daily., Disp: , Rfl:  .  donepezil (ARICEPT) 5 MG tablet, Take 1 tablet (5 mg total) by mouth at bedtime. (Patient taking differently: Take 10 mg by mouth at bedtime. ), Disp: 30 tablet, Rfl: 5 .  levothyroxine (SYNTHROID, LEVOTHROID) 25 MCG tablet, Take 25 mcg by mouth daily before breakfast. Take with 29mcg tab, Disp: , Rfl: 0 .  levothyroxine (SYNTHROID, LEVOTHROID) 75 MCG tablet, Take 37.5 mcg by mouth daily before breakfast. With 12.1mcg, Disp: , Rfl: 3 .  omeprazole (PRILOSEC) 40 MG capsule, Take 1 capsule by mouth daily as needed. Acid reflux, Disp: , Rfl: 0 .  vitamin B-12 (CYANOCOBALAMIN) 1000 MCG tablet, Take by mouth., Disp: , Rfl:   Review of Systems  Constitutional: Negative.   Respiratory: Negative.   Cardiovascular: Negative.   Skin: Positive for color change and rash.    Social History  Substance Use Topics  . Smoking status: Never Smoker  . Smokeless tobacco: Never Used  . Alcohol use No   Objective:   BP 110/80 (BP Location: Left Arm, Patient Position: Sitting, Cuff Size: Large)   Pulse 64   Temp (!) 97.5 F (36.4 C) (Oral)   Resp 16   Wt 220 lb (99.8 kg)   BMI 32.49 kg/m  Vitals:     11/29/16 0820  BP: 110/80  Pulse: 64  Resp: 16  Temp: (!) 97.5 F (36.4 C)  TempSrc: Oral  Weight: 220 lb (99.8 kg)     Physical Exam  Constitutional: She appears well-developed and well-nourished. No distress.  Neck: Normal range of motion. Neck supple.  Cardiovascular: Normal rate, regular rhythm and normal heart sounds.  Exam reveals no gallop and no friction rub.   No murmur heard. Pulmonary/Chest: Effort normal and breath sounds normal. No respiratory distress. She has no wheezes. She has no rales.  Skin: She is not diaphoretic.     Vitals reviewed.       Assessment & Plan:     1. Spider bite wound, accidental or unintentional, initial encounter Suspect spider bite due to appearance. Keflex prescribed as below. May use warm compresses. Advised to put something between her skin and heating pad to prevent burn. She is to call if symptoms worsen or if the darkened area starts to ulcerate.  - cephALEXin (KEFLEX) 500 MG capsule; Take 1 capsule (500 mg total) by mouth 3 (three) times daily.  Dispense: 21 capsule; Refill: 0       Mar Daring, PA-C  [facility]

## 2017-01-08 ENCOUNTER — Ambulatory Visit (INDEPENDENT_AMBULATORY_CARE_PROVIDER_SITE_OTHER): Payer: Medicare Other | Admitting: Physician Assistant

## 2017-01-08 ENCOUNTER — Encounter: Payer: Self-pay | Admitting: Physician Assistant

## 2017-01-08 VITALS — BP 120/60 | HR 60 | Temp 98.4°F | Resp 16 | Wt 218.2 lb

## 2017-01-08 DIAGNOSIS — N390 Urinary tract infection, site not specified: Secondary | ICD-10-CM

## 2017-01-08 LAB — POCT URINALYSIS DIPSTICK
Bilirubin, UA: NEGATIVE
Glucose, UA: NEGATIVE
Ketones, UA: NEGATIVE
Nitrite, UA: POSITIVE
Protein, UA: 100
SPEC GRAV UA: 1.02 (ref 1.010–1.025)
Urobilinogen, UA: 0.2 E.U./dL
pH, UA: 6 (ref 5.0–8.0)

## 2017-01-08 MED ORDER — SULFAMETHOXAZOLE-TRIMETHOPRIM 800-160 MG PO TABS: 1.0000 | ORAL_TABLET | Freq: Two times a day (BID) | ORAL | 0 refills | Status: DC

## 2017-01-08 NOTE — Patient Instructions (Addendum)
AZO Tabs

## 2017-01-08 NOTE — Progress Notes (Signed)
Patient: Kathryn Brooks Female    DOB: 1935/05/26   81 y.o.   MRN: [id number] Visit Date: 01/08/2017  Today's Provider: Mar Daring, PA-C   Chief Complaint  Patient presents with  . Urinary Tract Infection   Subjective:    Urinary Tract Infection   This is a new problem. The current episode started yesterday. The problem occurs every urination. The problem has been gradually worsening. The quality of the pain is described as aching and burning. The pain is at a severity of 5/10. The pain is moderate. There has been no fever. She is not sexually active. There is no history of pyelonephritis. Associated symptoms include chills, frequency and urgency. Pertinent negatives include no discharge, flank pain, hematuria, nausea, sweats or vomiting. She has tried nothing (Patient reports that she uses a pad every day) for the symptoms. The treatment provided no relief. There is no history of kidney stones or recurrent UTIs.      No Known Allergies   Current Outpatient Prescriptions:  .  aspirin 81 MG tablet, Take 81 mg by mouth daily., Disp: , Rfl:  .  cholecalciferol (VITAMIN D) 1000 units tablet, Take 1,000 Units by mouth daily., Disp: , Rfl:  .  donepezil (ARICEPT) 5 MG tablet, Take 1 tablet (5 mg total) by mouth at bedtime. (Patient taking differently: Take 10 mg by mouth at bedtime. ), Disp: 30 tablet, Rfl: 5 .  levothyroxine (SYNTHROID, LEVOTHROID) 25 MCG tablet, Take 25 mcg by mouth daily before breakfast. Take with 19mcg tab, Disp: , Rfl: 0 .  levothyroxine (SYNTHROID, LEVOTHROID) 75 MCG tablet, Take 37.5 mcg by mouth daily before breakfast. With 12.80mcg, Disp: , Rfl: 3 .  omeprazole (PRILOSEC) 40 MG capsule, Take 1 capsule by mouth daily as needed. Acid reflux, Disp: , Rfl: 0 .  vitamin B-12 (CYANOCOBALAMIN) 1000 MCG tablet, Take by mouth., Disp: , Rfl:  .  cephALEXin (KEFLEX) 500 MG capsule, Take 1 capsule (500 mg total) by mouth 3 (three) times daily. (Patient not  taking: Reported on 01/08/2017), Disp: 21 capsule, Rfl: 0  Review of Systems  Constitutional: Positive for chills and fatigue. Negative for fever.  Respiratory: Negative for cough and chest tightness.   Cardiovascular: Negative for chest pain, palpitations and leg swelling.  Gastrointestinal: Negative for abdominal pain, nausea and vomiting.  Genitourinary: Positive for dysuria, frequency and urgency. Negative for flank pain and hematuria.  Musculoskeletal: Negative for back pain.    Social History  Substance Use Topics  . Smoking status: Never Smoker  . Smokeless tobacco: Never Used  . Alcohol use No   Objective:   BP 120/60 (BP Location: Right Arm, Patient Position: Sitting, Cuff Size: Large)   Pulse 60   Temp 98.4 F (36.9 C) (Oral)   Resp 16   Wt 218 lb 3.2 oz (99 kg)   BMI 32.22 kg/m    Physical Exam  Constitutional: She is oriented to person, place, and time. She appears well-developed and well-nourished. No distress.  Cardiovascular: Normal rate, regular rhythm and normal heart sounds.  Exam reveals no gallop and no friction rub.   No murmur heard. Pulmonary/Chest: Effort normal and breath sounds normal. No respiratory distress. She has no wheezes. She has no rales.  Abdominal: Soft. Normal appearance and bowel sounds are normal. She exhibits no distension and no mass. There is no hepatosplenomegaly. There is tenderness in the suprapubic area. There is no rebound, no guarding and no CVA tenderness.  Neurological:  She is alert and oriented to person, place, and time.  Skin: Skin is warm and dry. She is not diaphoretic.        Assessment & Plan:      1. Urinary tract infection without hematuria, site unspecified Worsening symptoms. UA positive. Will treat empirically with Bactrim as below. Azo for dysuria. Continue to push fluids. Urine sent for culture. Will follow up pending C&S results. She is to call if symptoms do not improve or if they worsen.  - POCT urinalysis  dipstick - Urine Culture - sulfamethoxazole-trimethoprim (BACTRIM DS,SEPTRA DS) 800-160 MG tablet; Take 1 tablet by mouth 2 (two) times daily.  Dispense: 20 tablet; Refill: 0       Mar Daring, PA-C  [facility]

## 2017-01-10 ENCOUNTER — Telehealth: Payer: Self-pay

## 2017-01-10 LAB — URINE CULTURE

## 2017-01-10 NOTE — Telephone Encounter (Signed)
Pt advised.  She reports feeling better.   Thanks,   -Mickel Baas

## 2017-01-10 NOTE — Telephone Encounter (Signed)
-----   Message from Mar Daring, [place] sent at 01/10/2017 12:53 PM EDT ----- Urine culture positive for e coli and susceptible to bactrim. Continue until completed and call if symptoms do not completely resolve or if they return.

## 2017-03-06 ENCOUNTER — Ambulatory Visit (INDEPENDENT_AMBULATORY_CARE_PROVIDER_SITE_OTHER): Payer: Medicare Other

## 2017-03-06 VITALS — BP 150/72 | HR 72 | Temp 97.5°F | Ht 69.0 in | Wt 220.8 lb

## 2017-03-06 DIAGNOSIS — Z0000 Encounter for general adult medical examination without abnormal findings: Secondary | ICD-10-CM | POA: Diagnosis not present

## 2017-03-06 NOTE — Progress Notes (Signed)
Subjective:   Kathryn Brooks is a 81 y.o. female who presents for Medicare Annual (Subsequent) preventive examination.  Review of Systems:  N/A  Cardiac Risk Factors include: advanced age (>71men, >72 women);obesity (BMI >30kg/m2)     Objective:     Vitals: BP (!) 152/76 (BP Location: Left Arm)   Pulse 72   Temp (!) 97.5 F (36.4 C) (Oral)   Ht 5\' 9"  (1.753 m)   Wt 220 lb 12.8 oz (100.2 kg)   BMI 32.61 kg/m   Body mass index is 32.61 kg/m.   Tobacco History  Smoking Status  . Never Smoker  Smokeless Tobacco  . Never Used     Counseling given: Not Answered   Past Medical History:  Diagnosis Date  . Barrett's esophagus [id number]   biopsies were completed at 34,32,30,and 26 cm from the incisors. gastric cardia type mucosa was identified from 30-34 cm. Squamocolumnar mucosa with reflux gastroesophagitis and Barrett's esophagus was noted at 26 cm. No atypia or dysplasia was identified. The previous biopsies of December 18, 2010 at 25,30,35 cm showed similar changes. There has been no progression of the Barrett's(long segment) and  . Barrett's esophagus determined by biopsy 2012   No atypia on March 2014 biopsy.  . Blood in stool [id number]  . Breast screening, unspecified 2013  . GERD (gastroesophageal reflux disease) [id number]  . Obesity, unspecified 2013  . Special screening for malignant neoplasms, colon 2013  . Thyroid disease    Past Surgical History:  Procedure Laterality Date  . COLONOSCOPY N/A 2005,2012   heme positive stools  . ESOPHAGOGASTRODUODENOSCOPY N/A 09/28/2014   Procedure: ESOPHAGOGASTRODUODENOSCOPY (EGD);  Surgeon: Robert Bellow, MD;  Location[facility] ENDOSCOPY;  Service: Endoscopy;  Laterality: N/A;  . ESOPHAGOGASTRODUODENOSCOPY (EGD) WITH PROPOFOL N/A 10/30/2016   Procedure: ESOPHAGOGASTRODUODENOSCOPY (EGD) WITH PROPOFOL;  Surgeon: Robert Bellow, MD;  Location: [facility] ENDOSCOPY;  Service: Endoscopy;  Laterality: N/A;  . THYROIDECTOMY Right 2015    . TUBAL LIGATION    . UPPER GASTROINTESTINAL ENDOSCOPY N/A 2012   chronic grastitis, Barrett's esophagus   Family History  Problem Relation Age of Onset  . Colon cancer Mother        07/19/2004  . Cancer Mother    History  Sexual Activity  . Sexual activity: Not on file    Outpatient Encounter Prescriptions as of 03/06/2017  Medication Sig  . aspirin 81 MG tablet Take 81 mg by mouth daily.  . cholecalciferol (VITAMIN D) 1000 units tablet Take 2,000 Units by mouth daily.   Marland Kitchen levothyroxine (SYNTHROID, LEVOTHROID) 25 MCG tablet Take 25 mcg by mouth daily before breakfast. Take with 65mcg tab  . levothyroxine (SYNTHROID, LEVOTHROID) 75 MCG tablet Take 37.5 mcg by mouth daily before breakfast. With 12.104mcg  . Memantine HCl (NAMENDA XR PO) Take 1 tablet by mouth daily.  Marland Kitchen omeprazole (PRILOSEC) 40 MG capsule Take 1 capsule by mouth daily as needed. Acid reflux  . vitamin B-12 (CYANOCOBALAMIN) 1000 MCG tablet Take 1,000 mcg by mouth daily.   . [DISCONTINUED] cephALEXin (KEFLEX) 500 MG capsule Take 1 capsule (500 mg total) by mouth 3 (three) times daily. (Patient not taking: Reported on 01/08/2017)  . [DISCONTINUED] donepezil (ARICEPT) 5 MG tablet Take 1 tablet (5 mg total) by mouth at bedtime. (Patient taking differently: Take 10 mg by mouth at bedtime. )  . [DISCONTINUED] sulfamethoxazole-trimethoprim (BACTRIM DS,SEPTRA DS) 800-160 MG tablet Take 1 tablet by mouth 2 (two) times daily.   No facility-administered encounter medications on file  as of 03/06/2017.     Activities of Daily Living In your present state of health, do you have any difficulty performing the following activities: 03/06/2017  Hearing? N  Vision? N  Difficulty concentrating or making decisions? Y  Comment on [place]  Walking or climbing stairs? Y  Comment SOB and right knee pain  Dressing or bathing? N  Doing errands, shopping? N  Preparing Food and eating ? N  Using the Toilet? N  In the past six months, have  you accidently leaked urine? N  Do you have problems with loss of bowel control? N  Managing your Medications? N  Managing your Finances? N  Housekeeping or managing your Housekeeping? N  Some recent data might be hidden    Patient Care Team: Birdie Sons, MD as PCP - General (Family Medicine) Bary Castilla, Forest Gleason, MD (General Surgery) Carloyn Manner, MD as Referring Physician (Otolaryngology) Vladimir Crofts, MD as Consulting Physician (Neurology) Anell Barr, OD as Consulting Physician (Optometry)    Assessment:     Exercise Activities and Dietary recommendations Current Exercise Habits: The patient does not participate in regular exercise at present, Exercise limited by: None identified  Goals    . Increase water intake          Recommend increasing water intake to 4-6 glasses a day.       Fall Risk Fall Risk  03/06/2017 11/29/2016 10/06/2015  Falls in the past year? No No Yes  Number falls in past yr: - - 1   Depression Screen PHQ 2/9 Scores 03/06/2017 11/29/2016 10/06/2015  PHQ - 2 Score 0 0 0     Cognitive Function: Pt declined screening today.         Immunization History  Administered Date(s) Administered  . Influenza, High Dose Seasonal PF 02/09/2015  . Pneumococcal Conjugate-13 08/01/2014  . Pneumococcal Polysaccharide-23 11/30/2010  . Td 06/21/2004   Screening Tests Health Maintenance  Topic Date Due  . TETANUS/TDAP  06/21/2014  . DEXA SCAN  08/10/2017  . INFLUENZA VACCINE  Completed  . PNA vac Low Risk Adult  Completed      Plan:  I have personally reviewed and addressed the Medicare Annual Wellness questionnaire and have noted the following in the patient's chart:  A. Medical and social history B. Use of alcohol, tobacco or illicit drugs  C. Current medications and supplements D. Functional ability and status E.  Nutritional status F.  Physical activity G. Advance directives H. List of other physicians I.  Hospitalizations,  surgeries, and ER visits in previous 12 months J.  [place] such as hearing and vision if needed, cognitive and depression L. Referrals and appointments - none  In addition, I have reviewed and discussed with patient certain preventive protocols, quality metrics, and best practice recommendations. A written personalized care plan for preventive services as well as general preventive health recommendations were provided to patient.  See attached scanned questionnaire for additional information.   Signed,  Fabio Neighbors, LPN Nurse Health Advisor   MD Recommendations: Pt declined tetanus and shingles vaccine today. Pt would like to speak with PCP further about receiving the shingles vaccine.

## 2017-03-06 NOTE — Patient Instructions (Signed)
Ms. Kathryn Brooks , Thank you for taking time to come for your Medicare Wellness Visit. I appreciate your ongoing commitment to your health goals. Please review the following plan we discussed and let me know if I can assist you in the future.   Screening recommendations/referrals: Colonoscopy: Up to date Mammogram: Up to date Bone Density: Up to date Recommended yearly ophthalmology/optometry visit for glaucoma screening and checkup Recommended yearly dental visit for hygiene and checkup  Vaccinations: Influenza vaccine: completed Pneumococcal vaccine: completed series Tdap vaccine: declined Shingles vaccine: declined  Advanced directives: Advance directive discussed with you today. Even though you declined this today please call our office should you change your mind and we can give you the proper paperwork for you to fill out.   Conditions/risks identified: Obesity; Recommend increasing water intake to 4-6 glasses a day.   Next appointment: 03/27/17 @ 2:00 PM   Preventive Care 50 Years and Older, Female Preventive care refers to lifestyle choices and visits with your health care provider that can promote health and wellness. What does preventive care include?  A yearly physical exam. This is also called an annual well check.  Dental exams once or twice a year.  Routine eye exams. Ask your health care provider how often you should have your eyes checked.  Personal lifestyle choices, including:  Daily care of your teeth and gums.  Regular physical activity.  Eating a healthy diet.  Avoiding tobacco and drug use.  Limiting alcohol use.  Practicing safe sex.  Taking low-dose aspirin every day.  Taking vitamin and mineral supplements as recommended by your health care provider. What happens during an annual well check? The services and screenings done by your health care provider during your annual well check will depend on your age, overall health, lifestyle risk factors,  and family history of disease. Counseling  Your health care provider may ask you questions about your:  Alcohol use.  Tobacco use.  Drug use.  Emotional well-being.  Home and relationship well-being.  Sexual activity.  Eating habits.  History of falls.  Memory and ability to understand (cognition).  Work and work Statistician.  Reproductive health. Screening  You may have the following tests or measurements:  Height, weight, and BMI.  Blood pressure.  Lipid and cholesterol levels. These may be checked every 5 years, or more frequently if you are over 73 years old.  Skin check.  Lung cancer screening. You may have this screening every year starting at age 43 if you have a 30-pack-year history of smoking and currently smoke or have quit within the past 15 years.  Fecal occult blood test (FOBT) of the stool. You may have this test every year starting at age 57.  Flexible sigmoidoscopy or colonoscopy. You may have a sigmoidoscopy every 5 years or a colonoscopy every 10 years starting at age 35.  Hepatitis C blood test.  Hepatitis B blood test.  Sexually transmitted disease (STD) testing.  Diabetes screening. This is done by checking your blood sugar (glucose) after you have not eaten for a while (fasting). You may have this done every 1-3 years.  Bone density scan. This is done to screen for osteoporosis. You may have this done starting at age 25.  Mammogram. This may be done every 1-2 years. Talk to your health care provider about how often you should have regular mammograms. Talk with your health care provider about your test results, treatment options, and if necessary, the need for more tests. Vaccines  Your  health care provider may recommend certain vaccines, such as:  Influenza vaccine. This is recommended every year.  Tetanus, diphtheria, and acellular pertussis (Tdap, Td) vaccine. You may need a Td booster every 10 years.  Zoster vaccine. You may need  this after age 59.  Pneumococcal 13-valent conjugate (PCV13) vaccine. One dose is recommended after age 36.  Pneumococcal polysaccharide (PPSV23) vaccine. One dose is recommended after age 43. Talk to your health care provider about which screenings and vaccines you need and how often you need them. This information is not intended to replace advice given to you by your health care provider. Make sure you discuss any questions you have with your health care provider. Document Released: 05/26/2015 Document Revised: 01/17/2016 Document Reviewed: 02/28/2015 Elsevier Interactive Patient Education  2017 [place] Prevention in the Home Falls can cause injuries. They can happen to people of all ages. There are many things you can do to make your home safe and to help prevent falls. What can I do on the outside of my home?  Regularly fix the edges of walkways and driveways and fix any cracks.  Remove anything that might make you trip as you walk through a door, such as a raised step or threshold.  Trim any bushes or trees on the path to your home.  Use bright outdoor lighting.  Clear any walking paths of anything that might make someone trip, such as rocks or tools.  Regularly check to see if handrails are loose or broken. Make sure that both sides of any steps have handrails.  Any raised decks and porches should have guardrails on the edges.  Have any leaves, snow, or ice cleared regularly.  Use sand or salt on walking paths during winter.  Clean up any spills in your garage right away. This includes oil or grease spills. What can I do in the bathroom?  Use night lights.  Install grab bars by the toilet and in the tub and shower. Do not use towel bars as grab bars.  Use non-skid mats or decals in the tub or shower.  If you need to sit down in the shower, use a plastic, non-slip stool.  Keep the floor dry. Clean up any water that spills on the floor as soon as it  happens.  Remove soap buildup in the tub or shower regularly.  Attach bath mats securely with double-sided non-slip rug tape.  Do not have throw rugs and other things on the floor that can make you trip. What can I do in the bedroom?  Use night lights.  Make sure that you have a light by your bed that is easy to reach.  Do not use any sheets or blankets that are too big for your bed. They should not hang down onto the floor.  Have a firm chair that has side arms. You can use this for support while you get dressed.  Do not have throw rugs and other things on the floor that can make you trip. What can I do in the kitchen?  Clean up any spills right away.  Avoid walking on wet floors.  Keep items that you use a lot in easy-to-reach places.  If you need to reach something above you, use a strong step stool that has a grab bar.  Keep electrical cords out of the way.  Do not use floor polish or wax that makes floors slippery. If you must use wax, use non-skid floor wax.  Do not  have throw rugs and other things on the floor that can make you trip. What can I do with my stairs?  Do not leave any items on the stairs.  Make sure that there are handrails on both sides of the stairs and use them. Fix handrails that are broken or loose. Make sure that handrails are as long as the stairways.  Check any carpeting to make sure that it is firmly attached to the stairs. Fix any carpet that is loose or worn.  Avoid having throw rugs at the top or bottom of the stairs. If you do have throw rugs, attach them to the floor with carpet tape.  Make sure that you have a light switch at the top of the stairs and the bottom of the stairs. If you do not have them, ask someone to add them for you. What else can I do to help prevent falls?  Wear shoes that:  Do not have high heels.  Have rubber bottoms.  Are comfortable and fit you well.  Are closed at the toe. Do not wear sandals.  If you  use a stepladder:  Make sure that it is fully opened. Do not climb a closed stepladder.  Make sure that both sides of the stepladder are locked into place.  Ask someone to hold it for you, if possible.  Clearly mark and make sure that you can see:  Any grab bars or handrails.  First and last steps.  Where the edge of each step is.  Use tools that help you move around (mobility aids) if they are needed. These include:  Canes.  Walkers.  Scooters.  Crutches.  Turn on the lights when you go into a dark area. Replace any light bulbs as soon as they burn out.  Set up your furniture so you have a clear path. Avoid moving your furniture around.  If any of your floors are uneven, fix them.  If there are any pets around you, be aware of where they are.  Review your medicines with your doctor. Some medicines can make you feel dizzy. This can increase your chance of falling. Ask your doctor what other things that you can do to help prevent falls. This information is not intended to replace advice given to you by your health care provider. Make sure you discuss any questions you have with your health care provider. Document Released: 02/23/2009 Document Revised: 10/05/2015 Document Reviewed: 06/03/2014 Elsevier Interactive Patient Education  2017 [employer].

## 2017-03-20 ENCOUNTER — Encounter: Payer: Self-pay | Admitting: General Surgery

## 2017-03-20 NOTE — Progress Notes (Signed)
Notice received of flu vaccination from [employer].  Fluzone, Quadravent administered March 05, 2017.

## 2017-03-27 ENCOUNTER — Encounter: Payer: Self-pay | Admitting: Physician Assistant

## 2017-03-27 ENCOUNTER — Ambulatory Visit (INDEPENDENT_AMBULATORY_CARE_PROVIDER_SITE_OTHER): Payer: Medicare Other | Admitting: Physician Assistant

## 2017-03-27 VITALS — BP 124/70 | HR 80 | Temp 97.5°F | Resp 16 | Ht 69.0 in | Wt 213.0 lb

## 2017-03-27 DIAGNOSIS — Z0000 Encounter for general adult medical examination without abnormal findings: Secondary | ICD-10-CM | POA: Diagnosis not present

## 2017-03-27 DIAGNOSIS — E781 Pure hyperglyceridemia: Secondary | ICD-10-CM

## 2017-03-27 DIAGNOSIS — E890 Postprocedural hypothyroidism: Secondary | ICD-10-CM

## 2017-03-27 DIAGNOSIS — K22710 Barrett's esophagus with low grade dysplasia: Secondary | ICD-10-CM

## 2017-03-27 DIAGNOSIS — F0280 Dementia in other diseases classified elsewhere without behavioral disturbance: Secondary | ICD-10-CM | POA: Diagnosis not present

## 2017-03-27 DIAGNOSIS — Z1231 Encounter for screening mammogram for malignant neoplasm of breast: Secondary | ICD-10-CM | POA: Diagnosis not present

## 2017-03-27 DIAGNOSIS — G309 Alzheimer's disease, unspecified: Secondary | ICD-10-CM

## 2017-03-27 DIAGNOSIS — Z1239 Encounter for other screening for malignant neoplasm of breast: Secondary | ICD-10-CM

## 2017-03-27 LAB — COMPLETE METABOLIC PANEL WITH GFR
AG RATIO: 1.4 (calc) (ref 1.0–2.5)
ALBUMIN MSPROF: 4.1 g/dL (ref 3.6–5.1)
ALT: 13 U/L (ref 6–29)
AST: 18 U/L (ref 10–35)
Alkaline phosphatase (APISO): 70 U/L (ref 33–130)
BUN / CREAT RATIO: 16 (calc) (ref 6–22)
BUN: 16 mg/dL (ref 7–25)
CALCIUM: 9.4 mg/dL (ref 8.6–10.4)
CO2: 28 mmol/L (ref 20–32)
CREATININE: 1.01 mg/dL — AB (ref 0.60–0.93)
Chloride: 105 mmol/L (ref 98–110)
GFR, EST AFRICAN AMERICAN: 61 mL/min/{1.73_m2} (ref >=60)
GFR, EST NON AFRICAN AMERICAN: 53 mL/min/{1.73_m2} — AB (ref >=60)
GLOBULIN: 2.9 g/dL (ref 1.9–3.7)
Glucose, Bld: 94 mg/dL (ref 65–99)
POTASSIUM: 3.7 mmol/L (ref 3.5–5.3)
SODIUM: 141 mmol/L (ref 135–146)
TOTAL PROTEIN: 7 g/dL (ref 6.1–8.1)
Total Bilirubin: 0.5 mg/dL (ref 0.2–1.2)

## 2017-03-27 LAB — CBC WITH DIFFERENTIAL/PLATELET
BASOS ABS: 47 {cells}/uL (ref 0–200)
BASOS PCT: 0.7 %
EOS ABS: 80 {cells}/uL (ref 15–500)
Eosinophils Relative: 1.2 %
HCT: 40.4 % (ref 35.0–45.0)
Hemoglobin: 13.7 g/dL (ref 11.7–15.5)
Lymphs Abs: 1414 cells/uL (ref 850–3900)
MCH: 29.6 pg (ref 27.0–33.0)
MCHC: 33.9 g/dL (ref 32.0–36.0)
MCV: 87.3 fL (ref 80.0–100.0)
MONOS PCT: 6.6 %
MPV: 9.2 fL (ref 7.5–12.5)
Neutro Abs: 4717 cells/uL (ref 1500–7800)
Neutrophils Relative %: 70.4 %
Platelets: 305 10*3/uL (ref 140–400)
RBC: 4.63 10*6/uL (ref 3.80–5.10)
RDW: 13.3 % (ref 11.0–15.0)
Total Lymphocyte: 21.1 %
WBC mixed population: 442 cells/uL (ref 200–950)
WBC: 6.7 10*3/uL (ref 3.8–10.8)

## 2017-03-27 LAB — LIPID PANEL
CHOL/HDL RATIO: 3.7 (calc) (ref <5.0)
CHOLESTEROL: 221 mg/dL — AB (ref <200)
HDL: 60 mg/dL (ref >50)
LDL CHOLESTEROL (CALC): 135 mg/dL — AB
NON-HDL CHOLESTEROL (CALC): 161 mg/dL — AB (ref <130)
Triglycerides: 140 mg/dL (ref <150)

## 2017-03-27 LAB — TSH: TSH: 2.11 mIU/L (ref 0.40–4.50)

## 2017-03-27 NOTE — Patient Instructions (Signed)
Health Maintenance for Postmenopausal Women Menopause is a normal process in which your reproductive ability comes to an end. This process happens gradually over a span of months to years, usually between the ages of 22 and 9. Menopause is complete when you have missed 12 consecutive menstrual periods. It is important to talk with your health care provider about some of the most common conditions that affect postmenopausal women, such as heart disease, cancer, and bone loss (osteoporosis). Adopting a healthy lifestyle and getting preventive care can help to promote your health and wellness. Those actions can also lower your chances of developing some of these common conditions. What should I know about menopause? During menopause, you may experience a number of symptoms, such as:  Moderate-to-severe hot flashes.  Night sweats.  Decrease in sex drive.  Mood swings.  Headaches.  Tiredness.  Irritability.  Memory problems.  Insomnia.  Choosing to treat or not to treat menopausal changes is an individual decision that you make with your health care provider. What should I know about hormone replacement therapy and supplements? Hormone therapy products are effective for treating symptoms that are associated with menopause, such as hot flashes and night sweats. Hormone replacement carries certain risks, especially as you become older. If you are thinking about using estrogen or estrogen with progestin treatments, discuss the benefits and risks with your health care provider. What should I know about heart disease and stroke? Heart disease, heart attack, and stroke become more likely as you age. This may be due, in part, to the hormonal changes that your body experiences during menopause. These can affect how your body processes dietary fats, triglycerides, and cholesterol. Heart attack and stroke are both medical emergencies. There are many things that you can do to help prevent heart disease  and stroke:  Have your blood pressure checked at least every 1-2 years. High blood pressure causes heart disease and increases the risk of stroke.  If you are 53-22 years old, ask your health care provider if you should take aspirin to prevent a heart attack or a stroke.  Do not use any tobacco products, including cigarettes, chewing tobacco, or electronic cigarettes. If you need help quitting, ask your health care provider.  It is important to eat a healthy diet and maintain a healthy weight. ? Be sure to include plenty of vegetables, fruits, low-fat dairy products, and lean protein. ? Avoid eating foods that are high in solid fats, added sugars, or salt (sodium).  Get regular exercise. This is one of the most important things that you can do for your health. ? Try to exercise for at least 150 minutes each week. The type of exercise that you do should increase your heart rate and make you sweat. This is known as moderate-intensity exercise. ? Try to do strengthening exercises at least twice each week. Do these in addition to the moderate-intensity exercise.  Know your numbers.Ask your health care provider to check your cholesterol and your blood glucose. Continue to have your blood tested as directed by your health care provider.  What should I know about cancer screening? There are several types of cancer. Take the following steps to reduce your risk and to catch any cancer development as early as possible. Breast Cancer  Practice breast self-awareness. ? This means understanding how your breasts normally appear and feel. ? It also means doing regular breast self-exams. Let your health care provider know about any changes, no matter how small.  If you are 40  or older, have a clinician do a breast exam (clinical breast exam or CBE) every year. Depending on your age, family history, and medical history, it may be recommended that you also have a yearly breast X-ray (mammogram).  If you  have a family history of breast cancer, talk with your health care provider about genetic screening.  If you are at high risk for breast cancer, talk with your health care provider about having an MRI and a mammogram every year.  Breast cancer (BRCA) gene test is recommended for women who have family members with BRCA-related cancers. Results of the assessment will determine the need for genetic counseling and BRCA1 and for BRCA2 testing. BRCA-related cancers include these types: ? Breast. This occurs in males or females. ? Ovarian. ? Tubal. This may also be called fallopian tube cancer. ? Cancer of the abdominal or pelvic lining (peritoneal cancer). ? Prostate. ? Pancreatic.  Cervical, Uterine, and Ovarian Cancer Your health care provider may recommend that you be screened regularly for cancer of the pelvic organs. These include your ovaries, uterus, and vagina. This screening involves a pelvic exam, which includes checking for microscopic changes to the surface of your cervix (Pap test).  For women ages 21-65, health care providers may recommend a pelvic exam and a Pap test every three years. For women ages 79-65, they may recommend the Pap test and pelvic exam, combined with testing for human papilloma virus (HPV), every five years. Some types of HPV increase your risk of cervical cancer. Testing for HPV may also be done on women of any age who have unclear Pap test results.  Other health care providers may not recommend any screening for nonpregnant women who are considered low risk for pelvic cancer and have no symptoms. Ask your health care provider if a screening pelvic exam is right for you.  If you have had past treatment for cervical cancer or a condition that could lead to cancer, you need Pap tests and screening for cancer for at least 20 years after your treatment. If Pap tests have been discontinued for you, your risk factors (such as having a new sexual partner) need to be  reassessed to determine if you should start having screenings again. Some women have medical problems that increase the chance of getting cervical cancer. In these cases, your health care provider may recommend that you have screening and Pap tests more often.  If you have a family history of uterine cancer or ovarian cancer, talk with your health care provider about genetic screening.  If you have vaginal bleeding after reaching menopause, tell your health care provider.  There are currently no reliable tests available to screen for ovarian cancer.  Lung Cancer Lung cancer screening is recommended for adults 69-62 years old who are at high risk for lung cancer because of a history of smoking. A yearly low-dose CT scan of the lungs is recommended if you:  Currently smoke.  Have a history of at least 30 pack-years of smoking and you currently smoke or have quit within the past 15 years. A pack-year is smoking an average of one pack of cigarettes per day for one year.  Yearly screening should:  Continue until it has been 15 years since you quit.  Stop if you develop a health problem that would prevent you from having lung cancer treatment.  Colorectal Cancer  This type of cancer can be detected and can often be prevented.  Routine colorectal cancer screening usually begins at  age 42 and continues through age 45.  If you have risk factors for colon cancer, your health care provider may recommend that you be screened at an earlier age.  If you have a family history of colorectal cancer, talk with your health care provider about genetic screening.  Your health care provider may also recommend using home test kits to check for hidden blood in your stool.  A small camera at the end of a tube can be used to examine your colon directly (sigmoidoscopy or colonoscopy). This is done to check for the earliest forms of colorectal cancer.  Direct examination of the colon should be repeated every  5-10 years until age 71. However, if early forms of precancerous polyps or small growths are found or if you have a family history or genetic risk for colorectal cancer, you may need to be screened more often.  Skin Cancer  Check your skin from head to toe regularly.  Monitor any moles. Be sure to tell your health care provider: ? About any new moles or changes in moles, especially if there is a change in a mole's shape or color. ? If you have a mole that is larger than the size of a pencil eraser.  If any of your family members has a history of skin cancer, especially at a young age, talk with your health care provider about genetic screening.  Always use sunscreen. Apply sunscreen liberally and repeatedly throughout the day.  Whenever you are outside, protect yourself by wearing long sleeves, pants, a wide-brimmed hat, and sunglasses.  What should I know about osteoporosis? Osteoporosis is a condition in which bone destruction happens more quickly than new bone creation. After menopause, you may be at an increased risk for osteoporosis. To help prevent osteoporosis or the bone fractures that can happen because of osteoporosis, the following is recommended:  If you are 46-71 years old, get at least 1,000 mg of calcium and at least 600 mg of vitamin D per day.  If you are older than age 55 but younger than age 65, get at least 1,200 mg of calcium and at least 600 mg of vitamin D per day.  If you are older than age 54, get at least 1,200 mg of calcium and at least 800 mg of vitamin D per day.  Smoking and excessive alcohol intake increase the risk of osteoporosis. Eat foods that are rich in calcium and vitamin D, and do weight-bearing exercises several times each week as directed by your health care provider. What should I know about how menopause affects my mental health? Depression may occur at any age, but it is more common as you become older. Common symptoms of depression  include:  Low or sad mood.  Changes in sleep patterns.  Changes in appetite or eating patterns.  Feeling an overall lack of motivation or enjoyment of activities that you previously enjoyed.  Frequent crying spells.  Talk with your health care provider if you think that you are experiencing depression. What should I know about immunizations? It is important that you get and maintain your immunizations. These include:  Tetanus, diphtheria, and pertussis (Tdap) booster vaccine.  Influenza every year before the flu season begins.  Pneumonia vaccine.  Shingles vaccine.  Your health care provider may also recommend other immunizations. This information is not intended to replace advice given to you by your health care provider. Make sure you discuss any questions you have with your health care provider. Document Released: 06/21/2005  Document Revised: 11/17/2015 Document Reviewed: 01/31/2015 Elsevier Interactive Patient Education  2018 [employer]  

## 2017-03-27 NOTE — Progress Notes (Signed)
Patient: Kathryn Brooks, Female    DOB: 04/08/1936, 81 y.o.   MRN: [id number] Visit Date: 03/27/2017  Today's Provider: Mar Daring, PA-C   Chief Complaint  Patient presents with  . Medicare Wellness   Subjective:    Annual wellness visit Kathryn Brooks is a 81 y.o. female. She feels well. She reports exercising none. She reports she is sleeping well.  03/06/17 AWE with NHA 06/03/12 Mammogramgram-normal 08/11/14 BMD-normal -----------------------------------------------------------   Review of Systems  Constitutional: Negative.   HENT: Negative.   Eyes: Negative.   Respiratory: Negative.   Cardiovascular: Negative.   Gastrointestinal: Negative.   Endocrine: Negative.   Genitourinary: Negative.   Musculoskeletal: Negative.   Skin: Negative.   Allergic/Immunologic: Negative.   Neurological: Negative.   Hematological: Negative.   Psychiatric/Behavioral: Negative.     Social History   Socioeconomic History  . Marital status: Married    Spouse name: Not on file  . Number of children: Not on file  . Years of education: Not on file  . Highest education level: Not on file  Social Needs  . Financial resource strain: Not on file  . Food insecurity - worry: Not on file  . Food insecurity - inability: Not on file  . Transportation needs - medical: Not on file  . Transportation needs - non-medical: Not on file  Occupational History  . Not on file  Tobacco Use  . Smoking status: Never Smoker  . Smokeless tobacco: Never Used  Substance and Sexual Activity  . Alcohol use: No  . Drug use: No  . Sexual activity: Not on file  Other Topics Concern  . Not on file  Social History Narrative  . Not on file    Past Medical History:  Diagnosis Date  . Barrett's esophagus [id number]   biopsies were completed at 34,32,30,and 26 cm from the incisors. gastric cardia type mucosa was identified from 30-34 cm. Squamocolumnar mucosa with reflux gastroesophagitis  and Barrett's esophagus was noted at 26 cm. No atypia or dysplasia was identified. The previous biopsies of December 18, 2010 at 25,30,35 cm showed similar changes. There has been no progression of the Barrett's(long segment) and  . Barrett's esophagus determined by biopsy 2012   No atypia on March 2014 biopsy.  . Blood in stool [id number]  . Breast screening, unspecified 2013  . GERD (gastroesophageal reflux disease) [id number]  . Obesity, unspecified 2013  . Special screening for malignant neoplasms, colon 2013  . Thyroid disease      Patient Active Problem List   Diagnosis Date Noted  . Alzheimer's dementia 05/10/2015  . Allergic rhinitis 05/05/2015  . Adult BMI 30+ 05/05/2015  . Fatigue 05/05/2015  . Acid reflux 05/05/2015  . Hypertriglyceridemia 05/05/2015  . Breath shortness 05/05/2015  . Pain in shoulder 05/05/2015  . Skin lesion 05/05/2015  . Lump in thyroid 05/05/2015  . Avitaminosis D 05/05/2015  . Gastro-esophageal reflux disease without esophagitis 05/05/2015  . Barrett's esophagus     Past Surgical History:  Procedure Laterality Date  . COLONOSCOPY N/A 2005,2012   heme positive stools  . ESOPHAGOGASTRODUODENOSCOPY N/A 09/28/2014   Procedure: ESOPHAGOGASTRODUODENOSCOPY (EGD);  Surgeon: Robert Bellow, MD;  Location: [facility] ENDOSCOPY;  Service: Endoscopy;  Laterality: N/A;  . ESOPHAGOGASTRODUODENOSCOPY (EGD) WITH PROPOFOL N/A 10/30/2016   Procedure: ESOPHAGOGASTRODUODENOSCOPY (EGD) WITH PROPOFOL;  Surgeon: Robert Bellow, MD;  Location: [facility] ENDOSCOPY;  Service: Endoscopy;  Laterality: N/A;  . THYROIDECTOMY Right 2015  . TUBAL LIGATION    .  UPPER GASTROINTESTINAL ENDOSCOPY N/A 2012   chronic grastitis, Barrett's esophagus    Her family history includes Cancer in her mother; Colon cancer in her mother.      Current Outpatient Medications:  .  aspirin 81 MG tablet, Take 81 mg by mouth daily., Disp: , Rfl:  .  cholecalciferol (VITAMIN D) 1000 units tablet, Take  2,000 Units by mouth daily. , Disp: , Rfl:  .  levothyroxine (SYNTHROID, LEVOTHROID) 25 MCG tablet, Take 25 mcg by mouth daily before breakfast. Take with 73mcg tab, Disp: , Rfl: 0 .  levothyroxine (SYNTHROID, LEVOTHROID) 75 MCG tablet, Take 37.5 mcg by mouth daily before breakfast. With 12.39mcg, Disp: , Rfl: 3 .  Memantine HCl (NAMENDA XR PO), Take 1 tablet by mouth daily., Disp: , Rfl:  .  omeprazole (PRILOSEC) 40 MG capsule, Take 1 capsule by mouth daily as needed. Acid reflux, Disp: , Rfl: 0 .  vitamin B-12 (CYANOCOBALAMIN) 1000 MCG tablet, Take 1,000 mcg by mouth daily. , Disp: , Rfl:   Patient Care Team: Birdie Sons, MD as PCP - General (Family Medicine) Bary Castilla, Forest Gleason, MD (General Surgery) Carloyn Manner, MD as Referring Physician (Otolaryngology) Vladimir Crofts, MD as Consulting Physician (Neurology) Anell Barr, OD as Consulting Physician (Optometry)     Objective:   Vitals: BP 124/70 (BP Location: Left Arm, Patient Position: Sitting, Cuff Size: Large)   Pulse 80   Temp (!) 97.5 F (36.4 C) (Oral)   Resp 16   Ht 5\' 9"  (1.753 m)   Wt 213 lb (96.6 kg)   BMI 31.45 kg/m   Physical Exam  Constitutional: She is oriented to person, place, and time. She appears well-developed and well-nourished. No distress.  HENT:  Head: Normocephalic and atraumatic.  Right Ear: Hearing, tympanic membrane, external ear and ear canal normal.  Left Ear: Hearing, tympanic membrane, external ear and ear canal normal.  Nose: Nose normal.  Mouth/Throat: Uvula is midline, oropharynx is clear and moist and mucous membranes are normal. No oropharyngeal exudate.  Eyes: Conjunctivae and EOM are normal. Pupils are equal, round, and reactive to light. Right eye exhibits no discharge. Left eye exhibits no discharge. No scleral icterus.  Neck: Normal range of motion. Neck supple. No JVD present. Carotid bruit is not present. No tracheal deviation present. No thyromegaly present.    Cardiovascular: Normal rate, regular rhythm, normal heart sounds and intact distal pulses. Exam reveals no gallop and no friction rub.  No murmur heard. Pulmonary/Chest: Effort normal and breath sounds normal. No respiratory distress. She has no wheezes. She has no rales. She exhibits no tenderness.  Abdominal: Soft. Bowel sounds are normal. She exhibits no distension and no mass. There is no tenderness. There is no rebound and no guarding.  Musculoskeletal: Normal range of motion. She exhibits no edema or tenderness.  Lymphadenopathy:    She has no cervical adenopathy.  Neurological: She is alert and oriented to person, place, and time.  Skin: Skin is warm and dry. No rash noted. She is not diaphoretic.  Psychiatric: She has a normal mood and affect. Her behavior is normal. Judgment and thought content normal.  Vitals reviewed.   Activities of Daily Living In your present state of health, do you have any difficulty performing the following activities: 03/06/2017  Hearing? N  Vision? N  Difficulty concentrating or making decisions? Y  Comment on Namenda  Walking or climbing stairs? Y  Comment SOB and right knee pain  Dressing or bathing? N  Doing errands, shopping? N  Preparing Food and eating ? N  Using the Toilet? N  In the past six months, have you accidently leaked urine? N  Do you have problems with loss of bowel control? N  Managing your Medications? N  Managing your Finances? N  Housekeeping or managing your Housekeeping? N  Some recent data might be hidden    Fall Risk Assessment Fall Risk  03/06/2017 11/29/2016 10/06/2015  Falls in the past year? No No Yes  Number falls in past yr: - - 1     Depression Screen PHQ 2/9 Scores 03/06/2017 11/29/2016 10/06/2015  PHQ - 2 Score 0 0 0    Cognitive Testing - 6-CIT  Correct? Score   What year is it? yes 0 0 or 4  What month is it? yes 0 0 or 3  Memorize:    Kathryn Brooks,  44,  [address]  [place],      What time is it?  (within 1 hour) yes 0 0 or 3  Count backwards from 20 yes 0 0, 2, or 4  Name the months of the year no 4 0, 2, or 4  Repeat name & address above no 6 0, 2, 4, 6, 8, or 10       TOTAL SCORE  10/28   Interpretation:  Normal  Normal (0-7) Abnormal (8-28)       Assessment & Plan:     Annual Wellness Visit  Reviewed patient's Family Medical History Reviewed and updated list of patient's medical providers Assessment of cognitive impairment was done Assessed patient's functional ability Established a written schedule for health screening [place] Completed and Reviewed  Exercise Activities and Dietary recommendations Goals    None      Immunization History  Administered Date(s) Administered  . Influenza, High Dose Seasonal PF 02/09/2015  . Pneumococcal Conjugate-13 08/01/2014  . Pneumococcal Polysaccharide-23 11/30/2010  . Td 06/21/2004    Health Maintenance  Topic Date Due  . TETANUS/TDAP  06/21/2014  . DEXA SCAN  08/10/2017  . INFLUENZA VACCINE  Completed  . PNA vac Low Risk Adult  Completed     Discussed health benefits of physical activity, and encouraged her to engage in regular exercise appropriate for her age and condition.    1. Annual physical exam Normal exam today for stated age. Up to date on immunizations with exception of Td. Advised to call insurance company to see if Td booster covered.   2. Breast cancer screening Patient refuses mammograms at this time. She does perform self breast exams. There is no family history of breast cancer.   3. Alzheimer's dementia without behavioral disturbance, unspecified timing of dementia onset Patient does have dementia and is followed by Dr. Manuella Ghazi. On Namenda. Scheduled to see Dr. Manuella Ghazi on 05/09/17.  4. Barrett's esophagus with low grade dysplasia Had EGD in 10/2016 with Dr. Jamal Collin. EGD stable. Continue omeprazole 40mg  and will have repeat EGD in 2 years with Dr. Bary Castilla.  - CBC  w/Diff/Platelet - COMPLETE METABOLIC PANEL WITH GFR  5. Hypertriglyceridemia Will check labs as below and f/u pending results. - CBC w/Diff/Platelet - COMPLETE METABOLIC PANEL WITH GFR - Lipid Profile  6. Postoperative hypothyroidism Will check labs as below and f/u pending results. - TSH  ------------------------------------------------------------------------------------------------------------    Mar Daring, PA-C  [facility]

## 2017-11-07 DIAGNOSIS — E538 Deficiency of other specified B group vitamins: Secondary | ICD-10-CM | POA: Insufficient documentation

## 2018-03-09 ENCOUNTER — Ambulatory Visit (INDEPENDENT_AMBULATORY_CARE_PROVIDER_SITE_OTHER): Payer: Medicare Other

## 2018-03-09 VITALS — BP 132/76 | HR 67 | Temp 97.5°F | Ht 69.0 in | Wt 226.0 lb

## 2018-03-09 DIAGNOSIS — Z0000 Encounter for general adult medical examination without abnormal findings: Secondary | ICD-10-CM | POA: Diagnosis not present

## 2018-03-09 DIAGNOSIS — Z23 Encounter for immunization: Secondary | ICD-10-CM | POA: Diagnosis not present

## 2018-03-09 NOTE — Patient Instructions (Signed)
Kathryn Brooks , Thank you for taking time to come for your Medicare Wellness Visit. I appreciate your ongoing commitment to your health goals. Please review the following plan we discussed and let me know if I can assist you in the future.   Screening recommendations/referrals: Colonoscopy: Up to date Mammogram: Up to date Bone Density: Up to date Recommended yearly ophthalmology/optometry visit for glaucoma screening and checkup Recommended yearly dental visit for hygiene and checkup  Vaccinations: Influenza vaccine: Up to date Pneumococcal vaccine: Up to date Tdap vaccine: Pt declines today.  Shingles vaccine: Pt declines today.     Advanced directives: Advance directive discussed with you today. Even though you declined this today please call our office should you change your mind and we can give you the proper paperwork for you to fill out.  Conditions/risks identified: Fall prevention; Obesity; Increase water intake.   Next appointment: 04/01/18 @ 3:00 PM with Fenton Malling.    Preventive Care 23 Years and Older, Female Preventive care refers to lifestyle choices and visits with your health care provider that can promote health and wellness. What does preventive care include?  A yearly physical exam. This is also called an annual well check.  Dental exams once or twice a year.  Routine eye exams. Ask your health care provider how often you should have your eyes checked.  Personal lifestyle choices, including:  Daily care of your teeth and gums.  Regular physical activity.  Eating a healthy diet.  Avoiding tobacco and drug use.  Limiting alcohol use.  Practicing safe sex.  Taking low-dose aspirin every day.  Taking vitamin and mineral supplements as recommended by your health care provider. What happens during an annual well check? The services and screenings done by your health care provider during your annual well check will depend on your age, overall  health, lifestyle risk factors, and family history of disease. Counseling  Your health care provider may ask you questions about your:  Alcohol use.  Tobacco use.  Drug use.  Emotional well-being.  Home and relationship well-being.  Sexual activity.  Eating habits.  History of falls.  Memory and ability to understand (cognition).  Work and work Statistician.  Reproductive health. Screening  You may have the following tests or measurements:  Height, weight, and BMI.  Blood pressure.  Lipid and cholesterol levels. These may be checked every 5 years, or more frequently if you are over 18 years old.  Skin check.  Lung cancer screening. You may have this screening every year starting at age 29 if you have a 30-pack-year history of smoking and currently smoke or have quit within the past 15 years.  Fecal occult blood test (FOBT) of the stool. You may have this test every year starting at age 47.  Flexible sigmoidoscopy or colonoscopy. You may have a sigmoidoscopy every 5 years or a colonoscopy every 10 years starting at age 22.  Hepatitis C blood test.  Hepatitis B blood test.  Sexually transmitted disease (STD) testing.  Diabetes screening. This is done by checking your blood sugar (glucose) after you have not eaten for a while (fasting). You may have this done every 1-3 years.  Bone density scan. This is done to screen for osteoporosis. You may have this done starting at age 34.  Mammogram. This may be done every 1-2 years. Talk to your health care provider about how often you should have regular mammograms. Talk with your health care provider about your test results, treatment options, and  if necessary, the need for more tests. Vaccines  Your health care provider may recommend certain vaccines, such as:  Influenza vaccine. This is recommended every year.  Tetanus, diphtheria, and acellular pertussis (Tdap, Td) vaccine. You may need a Td booster every 10  years.  Zoster vaccine. You may need this after age 52.  Pneumococcal 13-valent conjugate (PCV13) vaccine. One dose is recommended after age 82.  Pneumococcal polysaccharide (PPSV23) vaccine. One dose is recommended after age 40. Talk to your health care provider about which screenings and vaccines you need and how often you need them. This information is not intended to replace advice given to you by your health care provider. Make sure you discuss any questions you have with your health care provider. Document Released: 05/26/2015 Document Revised: 01/17/2016 Document Reviewed: 02/28/2015 Elsevier Interactive Patient Education  2017 [place] Prevention in the Home Falls can cause injuries. They can happen to people of all ages. There are many things you can do to make your home safe and to help prevent falls. What can I do on the outside of my home?  Regularly fix the edges of walkways and driveways and fix any cracks.  Remove anything that might make you trip as you walk through a door, such as a raised step or threshold.  Trim any bushes or trees on the path to your home.  Use bright outdoor lighting.  Clear any walking paths of anything that might make someone trip, such as rocks or tools.  Regularly check to see if handrails are loose or broken. Make sure that both sides of any steps have handrails.  Any raised decks and porches should have guardrails on the edges.  Have any leaves, snow, or ice cleared regularly.  Use sand or salt on walking paths during winter.  Clean up any spills in your garage right away. This includes oil or grease spills. What can I do in the bathroom?  Use night lights.  Install grab bars by the toilet and in the tub and shower. Do not use towel bars as grab bars.  Use non-skid mats or decals in the tub or shower.  If you need to sit down in the shower, use a plastic, non-slip stool.  Keep the floor dry. Clean up any water that  spills on the floor as soon as it happens.  Remove soap buildup in the tub or shower regularly.  Attach bath mats securely with double-sided non-slip rug tape.  Do not have throw rugs and other things on the floor that can make you trip. What can I do in the bedroom?  Use night lights.  Make sure that you have a light by your bed that is easy to reach.  Do not use any sheets or blankets that are too big for your bed. They should not hang down onto the floor.  Have a firm chair that has side arms. You can use this for support while you get dressed.  Do not have throw rugs and other things on the floor that can make you trip. What can I do in the kitchen?  Clean up any spills right away.  Avoid walking on wet floors.  Keep items that you use a lot in easy-to-reach places.  If you need to reach something above you, use a strong step stool that has a grab bar.  Keep electrical cords out of the way.  Do not use floor polish or wax that makes floors slippery. If you  must use wax, use non-skid floor wax.  Do not have throw rugs and other things on the floor that can make you trip. What can I do with my stairs?  Do not leave any items on the stairs.  Make sure that there are handrails on both sides of the stairs and use them. Fix handrails that are broken or loose. Make sure that handrails are as long as the stairways.  Check any carpeting to make sure that it is firmly attached to the stairs. Fix any carpet that is loose or worn.  Avoid having throw rugs at the top or bottom of the stairs. If you do have throw rugs, attach them to the floor with carpet tape.  Make sure that you have a light switch at the top of the stairs and the bottom of the stairs. If you do not have them, ask someone to add them for you. What else can I do to help prevent falls?  Wear shoes that:  Do not have high heels.  Have rubber bottoms.  Are comfortable and fit you well.  Are closed at the  toe. Do not wear sandals.  If you use a stepladder:  Make sure that it is fully opened. Do not climb a closed stepladder.  Make sure that both sides of the stepladder are locked into place.  Ask someone to hold it for you, if possible.  Clearly mark and make sure that you can see:  Any grab bars or handrails.  First and last steps.  Where the edge of each step is.  Use tools that help you move around (mobility aids) if they are needed. These include:  Canes.  Walkers.  Scooters.  Crutches.  Turn on the lights when you go into a dark area. Replace any light bulbs as soon as they burn out.  Set up your furniture so you have a clear path. Avoid moving your furniture around.  If any of your floors are uneven, fix them.  If there are any pets around you, be aware of where they are.  Review your medicines with your doctor. Some medicines can make you feel dizzy. This can increase your chance of falling. Ask your doctor what other things that you can do to help prevent falls. This information is not intended to replace advice given to you by your health care provider. Make sure you discuss any questions you have with your health care provider. Document Released: 02/23/2009 Document Revised: 10/05/2015 Document Reviewed: 06/03/2014 Elsevier Interactive Patient Education  2017 [employer].

## 2018-03-09 NOTE — Progress Notes (Signed)
Subjective:   Kathryn Brooks is a 82 y.o. female who presents for Medicare Annual (Subsequent) preventive examination.  Review of Systems:  N/A  Cardiac Risk Factors include: advanced age (>1men, >17 women);obesity (BMI >30kg/m2)     Objective:     Vitals: BP 132/76 (BP Location: Right Arm)   Pulse 67   Temp (!) 97.5 F (36.4 C) (Oral)   Ht 5\' 9"  (1.753 m)   Wt 226 lb (102.5 kg)   BMI 33.37 kg/m   Body mass index is 33.37 kg/m.  Advanced Directives 03/09/2018 03/06/2017 01/08/2017 10/30/2016 06/05/2016 10/06/2015 06/12/2015  Does Patient Have a Medical Advance Directive? No No No No No No No  Would patient like information on creating a medical advance directive? No - Patient declined No - Patient declined - - - - -    Tobacco Social History   Tobacco Use  Smoking Status Never Smoker  Smokeless Tobacco Never Used     Counseling given: Not Answered   Clinical Intake:  Pre-visit preparation completed: Yes  Pain : No/denies pain Pain Score: 0-No pain     Nutritional Status: BMI > 30  Obese Nutritional Risks: None Diabetes: No  How often do you need to have someone help you when you read instructions, pamphlets, or other written materials from your doctor or pharmacy?: 1 - Never  Interpreter Needed?: No  Information entered by :: [facility], LPN  Past Medical History:  Diagnosis Date  . Barrett's esophagus [id number]   biopsies were completed at 34,32,30,and 26 cm from the incisors. gastric cardia type mucosa was identified from 30-34 cm. Squamocolumnar mucosa with reflux gastroesophagitis and Barrett's esophagus was noted at 26 cm. No atypia or dysplasia was identified. The previous biopsies of December 18, 2010 at 25,30,35 cm showed similar changes. There has been no progression of the Barrett's(long segment) and  . Barrett's esophagus determined by biopsy 2012   No atypia on March 2014 biopsy.  . Blood in stool [id number]  . Breast screening, unspecified 2013    . GERD (gastroesophageal reflux disease) [id number]  . Obesity, unspecified 2013  . Special screening for malignant neoplasms, colon 2013  . Thyroid disease    Past Surgical History:  Procedure Laterality Date  . COLONOSCOPY N/A 2005,2012   heme positive stools  . ESOPHAGOGASTRODUODENOSCOPY N/A 09/28/2014   Procedure: ESOPHAGOGASTRODUODENOSCOPY (EGD);  Surgeon: Robert Bellow, MD;  Location[facility] ENDOSCOPY;  Service: Endoscopy;  Laterality: N/A;  . ESOPHAGOGASTRODUODENOSCOPY (EGD) WITH PROPOFOL N/A 10/30/2016   Procedure: ESOPHAGOGASTRODUODENOSCOPY (EGD) WITH PROPOFOL;  Surgeon: Robert Bellow, MD;  Location: [facility] ENDOSCOPY;  Service: Endoscopy;  Laterality: N/A;  . THYROIDECTOMY Right 2015  . TUBAL LIGATION    . UPPER GASTROINTESTINAL ENDOSCOPY N/A 2012   chronic grastitis, Barrett's esophagus   Family History  Problem Relation Age of Onset  . Colon cancer Mother        07/19/2004  . Cancer Mother    Social History   Socioeconomic History  . Marital status: Married    Spouse name: Not on file  . Number of children: 3  . Years of education: Not on file  . Highest education level: Associate degree: occupational, Hotel manager, or vocational program  Occupational History  . Occupation: retired  Scientific laboratory technician  . Financial resource strain: Not hard at all  . Food insecurity:    Worry: Never true    Inability: Never true  . Transportation needs:    Medical: No    Non-medical: No  Tobacco Use  . Smoking status: Never Smoker  . Smokeless tobacco: Never Used  Substance and Sexual Activity  . Alcohol use: No  . Drug use: No  . Sexual activity: Not on file  Lifestyle  . Physical activity:    Days per week: 0 days    Minutes per session: 0 min  . Stress: Not at all  Relationships  . Social connections:    Talks on phone: Patient refused    Gets together: Patient refused    Attends religious service: Patient refused    Active member of club or organization: Patient  refused    Attends meetings of clubs or organizations: Patient refused    Relationship status: Patient refused  Other Topics Concern  . Not on file  Social History Narrative  . Not on file    Outpatient Encounter Medications as of 03/09/2018  Medication Sig  . aspirin 81 MG tablet Take 81 mg by mouth daily.  Marland Kitchen donepezil (ARICEPT) 10 MG tablet Take 10 mg by mouth at bedtime.   Marland Kitchen levothyroxine (SYNTHROID, LEVOTHROID) 25 MCG tablet Take 25 mcg by mouth daily before breakfast. Take with 50mcg tab  . levothyroxine (SYNTHROID, LEVOTHROID) 75 MCG tablet Take 37.5 mcg by mouth daily before breakfast. With 12.63mcg  . Memantine HCl (NAMENDA XR PO) Take 1 tablet by mouth 2 (two) times daily.   . cholecalciferol (VITAMIN D) 1000 units tablet Take 2,000 Units by mouth daily.   Marland Kitchen omeprazole (PRILOSEC) 40 MG capsule Take 1 capsule by mouth daily as needed. Acid reflux  . vitamin B-12 (CYANOCOBALAMIN) 1000 MCG tablet Take 1,000 mcg by mouth daily.    No facility-administered encounter medications on file as of 03/09/2018.     Activities of Daily Living In your present state of health, do you have any difficulty performing the following activities: 03/09/2018  Hearing? N  Vision? N  Difficulty concentrating or making decisions? N  Walking or climbing stairs? Y  Comment Due to SOB when climbing steps.   Dressing or bathing? N  Doing errands, shopping? N  Preparing Food and eating ? Y  Comment Husband cooks.   Using the Toilet? N  In the past six months, have you accidently leaked urine? N  Do you have problems with loss of bowel control? N  Managing your Medications? N  Managing your Finances? Y  Comment Husband manages finances.   Housekeeping or managing your Housekeeping? Y  Comment Husband takes care of housework.   Some recent data might be hidden    Patient Care Team: Birdie Sons, MD as PCP - General (Family Medicine) Byrnett, Forest Gleason, MD (General Surgery) Carloyn Manner, MD as Referring Physician (Otolaryngology) Vladimir Crofts, MD as Consulting Physician (Neurology) Anell Barr, OD as Consulting Physician (Optometry)    Assessment:   This is a routine wellness examination for Kathryn Brooks.  Exercise Activities and Dietary recommendations Current Exercise Habits: The patient does not participate in regular exercise at present, Exercise limited by: Other - see comments(dementia)  Goals    . Increase water intake     Recommend increasing water intake to 4-6 glasses a day.     Marland Kitchen LIFESTYLE - DECREASE FALLS RISK     Recommend to remove any items from the home that may cause slips or trips.           Fall Risk Fall Risk  03/09/2018 03/06/2017 11/29/2016 10/06/2015  Falls in the past year? Yes No No Yes  Number  falls in past yr: 1 - - 1  Injury with Fall? No - - -  Follow up Falls prevention discussed - - -   FALL RISK PREVENTION PERTAINING TO THE HOME:  Any stairs in or around the home WITH handrails? No  Home free of loose throw rugs in walkways, pet beds, electrical cords, etc? Yes  Adequate lighting in your home to reduce risk of falls? Yes   ASSISTIVE DEVICES UTILIZED TO PREVENT FALLS:  Life alert? No  Use of a cane, walker or w/c? No  Grab bars in the bathroom? No  Shower chair or bench in shower? No  Elevated toilet seat or a handicapped toilet? No    TIMED UP AND GO:  Was the test performed? No .     Depression Screen PHQ 2/9 Scores 03/09/2018 03/06/2017 11/29/2016 10/06/2015  PHQ - 2 Score 0 0 0 0     Cognitive Function: Declined today.         Immunization History  Administered Date(s) Administered  . Influenza, High Dose Seasonal PF 02/09/2015, 03/09/2018  . Pneumococcal Conjugate-13 08/01/2014  . Pneumococcal Polysaccharide-23 11/30/2010  . Td 06/21/2004    Qualifies for Shingles Vaccine? Yes . Due for Shingrix. Education has been provided regarding the importance of this vaccine. Pt has been advised to  call insurance company to determine out of pocket expense. Advised may also receive vaccine at local pharmacy or Health Dept. Verbalized acceptance and understanding.  Tdap: Although this vaccine is not a covered service during a Wellness Exam, does the patient still wish to receive this vaccine today?  No .  Education has been provided regarding the importance of this vaccine. Advised may receive this vaccine at local pharmacy or Health Dept. Aware to provide a copy of the vaccination record if obtained from local pharmacy or Health Dept. Verbalized acceptance and understanding.  Flu Vaccine: Due for Flu vaccine. Does the patient want to receive this vaccine today?  Yes .   Pneumococcal Vaccine: Up to date  Screening Tests Health Maintenance  Topic Date Due  . TETANUS/TDAP  06/21/2014  . INFLUENZA VACCINE  Completed  . DEXA SCAN  Completed  . PNA vac Low Risk Adult  Completed    Cancer Screenings:  Colorectal Screening: Completed 12/18/10. No longer required.   Mammogram: Completed 05/05/15. No longer required.   Bone Density: Completed 08/11/14. .  Lung Cancer Screening: (Low Dose CT Chest recommended if Age 28-80 years, 30 pack-year currently smoking OR have quit w/in 15years.) does not qualify.    Additional Screening:  Vision Screening: Recommended annual ophthalmology exams for early detection of glaucoma and other disorders of the eye.  Dental Screening: Recommended annual dental exams for proper oral hygiene  Community Resource Referral:  CRR required this visit?  No       Plan:  I have personally reviewed and addressed the Medicare Annual Wellness questionnaire and have noted the following in the patient's chart:  A. Medical and social history B. Use of alcohol, tobacco or illicit drugs  C. Current medications and supplements D. Functional ability and status E.  Nutritional status F.  Physical activity G. Advance directives H. List of other physicians I.   Hospitalizations, surgeries, and ER visits in previous 12 months J.  [place] such as hearing and vision if needed, cognitive and depression L. Referrals and appointments - none  In addition, I have reviewed and discussed with patient certain preventive protocols, quality metrics, and best practice recommendations.  A written personalized care plan for preventive services as well as general preventive health recommendations were provided to patient.  See attached scanned questionnaire for additional information.   Signed,  Fabio Neighbors, LPN Nurse Health Advisor   Nurse Recommendations: Pt declined the tetanus vaccine today.

## 2018-04-01 ENCOUNTER — Ambulatory Visit
Admission: RE | Admit: 2018-04-01 | Discharge: 2018-04-01 | Disposition: A | Discharge status: Home / Self Care | Payer: Medicare Other | Source: Ambulatory Visit | Attending: Physician Assistant | Admitting: Physician Assistant

## 2018-04-01 ENCOUNTER — Ambulatory Visit (INDEPENDENT_AMBULATORY_CARE_PROVIDER_SITE_OTHER): Payer: Medicare Other | Admitting: Physician Assistant

## 2018-04-01 ENCOUNTER — Encounter: Payer: Self-pay | Admitting: Physician Assistant

## 2018-04-01 VITALS — BP 130/80 | HR 80 | Temp 97.5°F | Resp 16 | Ht 69.0 in | Wt 223.2 lb

## 2018-04-01 DIAGNOSIS — M25362 Other instability, left knee: Secondary | ICD-10-CM

## 2018-04-01 DIAGNOSIS — E559 Vitamin D deficiency, unspecified: Secondary | ICD-10-CM

## 2018-04-01 DIAGNOSIS — E781 Pure hyperglyceridemia: Secondary | ICD-10-CM | POA: Diagnosis not present

## 2018-04-01 DIAGNOSIS — Z0000 Encounter for general adult medical examination without abnormal findings: Secondary | ICD-10-CM | POA: Diagnosis not present

## 2018-04-01 DIAGNOSIS — G301 Alzheimer's disease with late onset: Secondary | ICD-10-CM

## 2018-04-01 DIAGNOSIS — E890 Postprocedural hypothyroidism: Secondary | ICD-10-CM | POA: Diagnosis not present

## 2018-04-01 DIAGNOSIS — F0280 Dementia in other diseases classified elsewhere without behavioral disturbance: Secondary | ICD-10-CM

## 2018-04-01 DIAGNOSIS — M1712 Unilateral primary osteoarthritis, left knee: Secondary | ICD-10-CM | POA: Insufficient documentation

## 2018-04-01 DIAGNOSIS — E6609 Other obesity due to excess calories: Secondary | ICD-10-CM

## 2018-04-01 DIAGNOSIS — Z9181 History of falling: Secondary | ICD-10-CM | POA: Diagnosis not present

## 2018-04-01 DIAGNOSIS — Z6832 Body mass index (BMI) 32.0-32.9, adult: Secondary | ICD-10-CM

## 2018-04-01 DIAGNOSIS — K22710 Barrett's esophagus with low grade dysplasia: Secondary | ICD-10-CM

## 2018-04-01 NOTE — Patient Instructions (Signed)
Health Maintenance for Postmenopausal Women Menopause is a normal process in which your reproductive ability comes to an end. This process happens gradually over a span of months to years, usually between the ages of 22 and 9. Menopause is complete when you have missed 12 consecutive menstrual periods. It is important to talk with your health care provider about some of the most common conditions that affect postmenopausal women, such as heart disease, cancer, and bone loss (osteoporosis). Adopting a healthy lifestyle and getting preventive care can help to promote your health and wellness. Those actions can also lower your chances of developing some of these common conditions. What should I know about menopause? During menopause, you may experience a number of symptoms, such as:  Moderate-to-severe hot flashes.  Night sweats.  Decrease in sex drive.  Mood swings.  Headaches.  Tiredness.  Irritability.  Memory problems.  Insomnia.  Choosing to treat or not to treat menopausal changes is an individual decision that you make with your health care provider. What should I know about hormone replacement therapy and supplements? Hormone therapy products are effective for treating symptoms that are associated with menopause, such as hot flashes and night sweats. Hormone replacement carries certain risks, especially as you become older. If you are thinking about using estrogen or estrogen with progestin treatments, discuss the benefits and risks with your health care provider. What should I know about heart disease and stroke? Heart disease, heart attack, and stroke become more likely as you age. This may be due, in part, to the hormonal changes that your body experiences during menopause. These can affect how your body processes dietary fats, triglycerides, and cholesterol. Heart attack and stroke are both medical emergencies. There are many things that you can do to help prevent heart disease  and stroke:  Have your blood pressure checked at least every 1-2 years. High blood pressure causes heart disease and increases the risk of stroke.  If you are 53-22 years old, ask your health care provider if you should take aspirin to prevent a heart attack or a stroke.  Do not use any tobacco products, including cigarettes, chewing tobacco, or electronic cigarettes. If you need help quitting, ask your health care provider.  It is important to eat a healthy diet and maintain a healthy weight. ? Be sure to include plenty of vegetables, fruits, low-fat dairy products, and lean protein. ? Avoid eating foods that are high in solid fats, added sugars, or salt (sodium).  Get regular exercise. This is one of the most important things that you can do for your health. ? Try to exercise for at least 150 minutes each week. The type of exercise that you do should increase your heart rate and make you sweat. This is known as moderate-intensity exercise. ? Try to do strengthening exercises at least twice each week. Do these in addition to the moderate-intensity exercise.  Know your numbers.Ask your health care provider to check your cholesterol and your blood glucose. Continue to have your blood tested as directed by your health care provider.  What should I know about cancer screening? There are several types of cancer. Take the following steps to reduce your risk and to catch any cancer development as early as possible. Breast Cancer  Practice breast self-awareness. ? This means understanding how your breasts normally appear and feel. ? It also means doing regular breast self-exams. Let your health care provider know about any changes, no matter how small.  If you are 40  or older, have a clinician do a breast exam (clinical breast exam or CBE) every year. Depending on your age, family history, and medical history, it may be recommended that you also have a yearly breast X-ray (mammogram).  If you  have a family history of breast cancer, talk with your health care provider about genetic screening.  If you are at high risk for breast cancer, talk with your health care provider about having an MRI and a mammogram every year.  Breast cancer (BRCA) gene test is recommended for women who have family members with BRCA-related cancers. Results of the assessment will determine the need for genetic counseling and BRCA1 and for BRCA2 testing. BRCA-related cancers include these types: ? Breast. This occurs in males or females. ? Ovarian. ? Tubal. This may also be called fallopian tube cancer. ? Cancer of the abdominal or pelvic lining (peritoneal cancer). ? Prostate. ? Pancreatic.  Cervical, Uterine, and Ovarian Cancer Your health care provider may recommend that you be screened regularly for cancer of the pelvic organs. These include your ovaries, uterus, and vagina. This screening involves a pelvic exam, which includes checking for microscopic changes to the surface of your cervix (Pap test).  For women ages 21-65, health care providers may recommend a pelvic exam and a Pap test every three years. For women ages 79-65, they may recommend the Pap test and pelvic exam, combined with testing for human papilloma virus (HPV), every five years. Some types of HPV increase your risk of cervical cancer. Testing for HPV may also be done on women of any age who have unclear Pap test results.  Other health care providers may not recommend any screening for nonpregnant women who are considered low risk for pelvic cancer and have no symptoms. Ask your health care provider if a screening pelvic exam is right for you.  If you have had past treatment for cervical cancer or a condition that could lead to cancer, you need Pap tests and screening for cancer for at least 20 years after your treatment. If Pap tests have been discontinued for you, your risk factors (such as having a new sexual partner) need to be  reassessed to determine if you should start having screenings again. Some women have medical problems that increase the chance of getting cervical cancer. In these cases, your health care provider may recommend that you have screening and Pap tests more often.  If you have a family history of uterine cancer or ovarian cancer, talk with your health care provider about genetic screening.  If you have vaginal bleeding after reaching menopause, tell your health care provider.  There are currently no reliable tests available to screen for ovarian cancer.  Lung Cancer Lung cancer screening is recommended for adults 69-62 years old who are at high risk for lung cancer because of a history of smoking. A yearly low-dose CT scan of the lungs is recommended if you:  Currently smoke.  Have a history of at least 30 pack-years of smoking and you currently smoke or have quit within the past 15 years. A pack-year is smoking an average of one pack of cigarettes per day for one year.  Yearly screening should:  Continue until it has been 15 years since you quit.  Stop if you develop a health problem that would prevent you from having lung cancer treatment.  Colorectal Cancer  This type of cancer can be detected and can often be prevented.  Routine colorectal cancer screening usually begins at  age 42 and continues through age 45.  If you have risk factors for colon cancer, your health care provider may recommend that you be screened at an earlier age.  If you have a family history of colorectal cancer, talk with your health care provider about genetic screening.  Your health care provider may also recommend using home test kits to check for hidden blood in your stool.  A small camera at the end of a tube can be used to examine your colon directly (sigmoidoscopy or colonoscopy). This is done to check for the earliest forms of colorectal cancer.  Direct examination of the colon should be repeated every  5-10 years until age 71. However, if early forms of precancerous polyps or small growths are found or if you have a family history or genetic risk for colorectal cancer, you may need to be screened more often.  Skin Cancer  Check your skin from head to toe regularly.  Monitor any moles. Be sure to tell your health care provider: ? About any new moles or changes in moles, especially if there is a change in a mole's shape or color. ? If you have a mole that is larger than the size of a pencil eraser.  If any of your family members has a history of skin cancer, especially at a young age, talk with your health care provider about genetic screening.  Always use sunscreen. Apply sunscreen liberally and repeatedly throughout the day.  Whenever you are outside, protect yourself by wearing long sleeves, pants, a wide-brimmed hat, and sunglasses.  What should I know about osteoporosis? Osteoporosis is a condition in which bone destruction happens more quickly than new bone creation. After menopause, you may be at an increased risk for osteoporosis. To help prevent osteoporosis or the bone fractures that can happen because of osteoporosis, the following is recommended:  If you are 46-71 years old, get at least 1,000 mg of calcium and at least 600 mg of vitamin D per day.  If you are older than age 55 but younger than age 65, get at least 1,200 mg of calcium and at least 600 mg of vitamin D per day.  If you are older than age 54, get at least 1,200 mg of calcium and at least 800 mg of vitamin D per day.  Smoking and excessive alcohol intake increase the risk of osteoporosis. Eat foods that are rich in calcium and vitamin D, and do weight-bearing exercises several times each week as directed by your health care provider. What should I know about how menopause affects my mental health? Depression may occur at any age, but it is more common as you become older. Common symptoms of depression  include:  Low or sad mood.  Changes in sleep patterns.  Changes in appetite or eating patterns.  Feeling an overall lack of motivation or enjoyment of activities that you previously enjoyed.  Frequent crying spells.  Talk with your health care provider if you think that you are experiencing depression. What should I know about immunizations? It is important that you get and maintain your immunizations. These include:  Tetanus, diphtheria, and pertussis (Tdap) booster vaccine.  Influenza every year before the flu season begins.  Pneumonia vaccine.  Shingles vaccine.  Your health care provider may also recommend other immunizations. This information is not intended to replace advice given to you by your health care provider. Make sure you discuss any questions you have with your health care provider. Document Released: 06/21/2005  Document Revised: 11/17/2015 Document Reviewed: 01/31/2015 Elsevier Interactive Patient Education  2018 [employer]  

## 2018-04-01 NOTE — Progress Notes (Signed)
Patient: Kathryn Brooks, Female    DOB: 1935-12-26, 82 y.o.   MRN: [id number] Visit Date: 04/01/2018  Today's Provider: Mar Daring, PA-C   Chief Complaint  Patient presents with  . Annual Exam   Subjective:  Patient had her AWV with [facility] on 03/09/18   Complete Physical Kathryn Brooks is a 82 y.o. female. She feels well. She reports exercising none. She reports she is sleeping well. -----------------------------------------------------------   Review of Systems  Constitutional: Negative.   HENT: Negative.   Eyes: Negative.   Respiratory: Negative.   Cardiovascular: Negative.   Gastrointestinal: Negative.   Endocrine: Negative.   Genitourinary: Negative.   Musculoskeletal: Negative.   Skin: Negative.   Allergic/Immunologic: Negative.   Neurological: Negative.   Hematological: Negative.   Psychiatric/Behavioral: Negative.     Social History   Socioeconomic History  . Marital status: Married    Spouse name: Not on file  . Number of children: 3  . Years of education: Not on file  . Highest education level: Associate degree: occupational, Hotel manager, or vocational program  Occupational History  . Occupation: retired  Scientific laboratory technician  . Financial resource strain: Not hard at all  . Food insecurity:    Worry: Never true    Inability: Never true  . Transportation needs:    Medical: No    Non-medical: No  Tobacco Use  . Smoking status: Never Smoker  . Smokeless tobacco: Never Used  Substance and Sexual Activity  . Alcohol use: No  . Drug use: No  . Sexual activity: Not on file  Lifestyle  . Physical activity:    Days per week: 0 days    Minutes per session: 0 min  . Stress: Not at all  Relationships  . Social connections:    Talks on phone: Patient refused    Gets together: Patient refused    Attends religious service: Patient refused    Active member of club or organization: Patient refused    Attends meetings of clubs or organizations:  Patient refused    Relationship status: Patient refused  . Intimate partner violence:    Fear of current or ex partner: Patient refused    Emotionally abused: Patient refused    Physically abused: Patient refused    Forced sexual activity: Patient refused  Other Topics Concern  . Not on file  Social History Narrative  . Not on file    Past Medical History:  Diagnosis Date  . Barrett's esophagus [id number]   biopsies were completed at 34,32,30,and 26 cm from the incisors. gastric cardia type mucosa was identified from 30-34 cm. Squamocolumnar mucosa with reflux gastroesophagitis and Barrett's esophagus was noted at 26 cm. No atypia or dysplasia was identified. The previous biopsies of December 18, 2010 at 25,30,35 cm showed similar changes. There has been no progression of the Barrett's(long segment) and  . Barrett's esophagus determined by biopsy 2012   No atypia on March 2014 biopsy.  . Blood in stool [id number]  . Breast screening, unspecified 2013  . GERD (gastroesophageal reflux disease) [id number]  . Obesity, unspecified 2013  . Special screening for malignant neoplasms, colon 2013  . Thyroid disease      Patient Active Problem List   Diagnosis Date Noted  . Alzheimer's dementia ([place]) 05/10/2015  . Allergic rhinitis 05/05/2015  . Adult BMI 30+ 05/05/2015  . Fatigue 05/05/2015  . Hypertriglyceridemia 05/05/2015  . Breath shortness 05/05/2015  . Pain in shoulder 05/05/2015  .  Skin lesion 05/05/2015  . Hypothyroid 05/05/2015  . Avitaminosis D 05/05/2015  . Gastro-esophageal reflux disease without esophagitis 05/05/2015  . Barrett's esophagus     Past Surgical History:  Procedure Laterality Date  . COLONOSCOPY N/A 2005,2012   heme positive stools  . ESOPHAGOGASTRODUODENOSCOPY N/A 09/28/2014   Procedure: ESOPHAGOGASTRODUODENOSCOPY (EGD);  Surgeon: Robert Bellow, MD;  Location[facility] ENDOSCOPY;  Service: Endoscopy;  Laterality: N/A;  . ESOPHAGOGASTRODUODENOSCOPY (EGD) WITH  PROPOFOL N/A 10/30/2016   Procedure: ESOPHAGOGASTRODUODENOSCOPY (EGD) WITH PROPOFOL;  Surgeon: Robert Bellow, MD;  Location: [facility] ENDOSCOPY;  Service: Endoscopy;  Laterality: N/A;  . THYROIDECTOMY Right 2015  . TUBAL LIGATION    . UPPER GASTROINTESTINAL ENDOSCOPY N/A 2012   chronic grastitis, Barrett's esophagus    Her family history includes Cancer in her mother; Colon cancer in her mother.      Current Outpatient Medications:  .  aspirin 81 MG tablet, Take 81 mg by mouth daily., Disp: , Rfl:  .  cholecalciferol (VITAMIN D) 1000 units tablet, Take 2,000 Units by mouth daily. , Disp: , Rfl:  .  donepezil (ARICEPT) 10 MG tablet, Take 10 mg by mouth at bedtime. , Disp: , Rfl:  .  levothyroxine (SYNTHROID, LEVOTHROID) 25 MCG tablet, Take 25 mcg by mouth daily before breakfast. Take with 13mcg tab, Disp: , Rfl: 0 .  levothyroxine (SYNTHROID, LEVOTHROID) 75 MCG tablet, Take 37.5 mcg by mouth daily before breakfast. With 12.30mcg, Disp: , Rfl: 3 .  Memantine HCl (NAMENDA XR PO), Take 1 tablet by mouth 2 (two) times daily. , Disp: , Rfl:  .  omeprazole (PRILOSEC) 40 MG capsule, Take 1 capsule by mouth daily as needed. Acid reflux, Disp: , Rfl: 0 .  vitamin B-12 (CYANOCOBALAMIN) 1000 MCG tablet, Take 1,000 mcg by mouth daily. , Disp: , Rfl:   Patient Care Team: Mar Daring, PA-C as PCP - General (Family Medicine) Bary Castilla, Forest Gleason, MD (General Surgery) Carloyn Manner, MD as Referring Physician (Otolaryngology) Vladimir Crofts, MD as Consulting Physician (Neurology) Anell Barr, OD as Consulting Physician (Optometry)     Objective:   Vitals: BP 130/80 (BP Location: Left Arm, Patient Position: Sitting, Cuff Size: Large)   Pulse 80   Temp (!) 97.5 F (36.4 C) (Oral)   Resp 16   Ht 5\' 9"  (1.753 m)   Wt 223 lb 3.2 oz (101.2 kg)   BMI 32.96 kg/m   Physical Exam  Constitutional: She is oriented to person, place, and time. She appears well-developed and well-nourished.    HENT:  Head: Normocephalic.  Right Ear: External ear normal.  Left Ear: External ear normal.  Nose: Nose normal.  Mouth/Throat: Oropharynx is clear and moist.  Eyes: Pupils are equal, round, and reactive to light. Conjunctivae and EOM are normal.  Neck: Normal range of motion. Neck supple.  Cardiovascular: Normal rate, regular rhythm, normal heart sounds and intact distal pulses.  Pulmonary/Chest: Effort normal and breath sounds normal.  Abdominal: Bowel sounds are normal.  Musculoskeletal: Normal range of motion.  Neurological: She is alert and oriented to person, place, and time.  Skin: Skin is warm and dry.  Psychiatric: She has a normal mood and affect. Her behavior is normal. Judgment and thought content normal.    Activities of Daily Living In your present state of health, do you have any difficulty performing the following activities: 03/09/2018  Hearing? N  Vision? N  Difficulty concentrating or making decisions? N  Walking or climbing stairs? Y  Comment  Due to SOB when climbing steps.   Dressing or bathing? N  Doing errands, shopping? N  Preparing Food and eating ? Y  Comment Husband cooks.   Using the Toilet? N  In the past six months, have you accidently leaked urine? N  Do you have problems with loss of bowel control? N  Managing your Medications? N  Managing your Finances? Y  Comment Husband manages finances.   Housekeeping or managing your Housekeeping? Y  Comment Husband takes care of housework.   Some recent data might be hidden    Fall Risk Assessment Fall Risk  03/09/2018 03/06/2017 11/29/2016 10/06/2015  Falls in the past year? Yes No No Yes  Number falls in past yr: 1 - - 1  Injury with Fall? No - - -  Follow up Falls prevention discussed - - -     Depression Screen PHQ 2/9 Scores 03/09/2018 03/06/2017 11/29/2016 10/06/2015  PHQ - 2 Score 0 0 0 0    Cognitive Testing - 6-CIT-pt declined on 03/09/18-Patient has Alzheimer's Dementia and followed by  Neurology 6CIT Screen 04/01/2018  What Year? 4 points  What month? 3 points  What time? 0 points  Count back from 20 0 points  Months in reverse 2 points  Repeat phrase 2 points  Total Score 11      Assessment & Plan:    Annual Physical Reviewed patient's Family Medical History Reviewed and updated list of patient's medical providers Assessment of cognitive impairment was done Assessed patient's functional ability Established a written schedule for health screening [place] Completed and Reviewed  Exercise Activities and Dietary recommendations Goals    . Increase water intake     Recommend increasing water intake to 4-6 glasses a day.     Marland Kitchen LIFESTYLE - DECREASE FALLS RISK     Recommend to remove any items from the home that may cause slips or trips.           Immunization History  Administered Date(s) Administered  . Influenza, High Dose Seasonal PF 02/09/2015, 03/09/2018  . Pneumococcal Conjugate-13 08/01/2014  . Pneumococcal Polysaccharide-23 11/30/2010  . Td 06/21/2004    Health Maintenance  Topic Date Due  . TETANUS/TDAP  06/21/2014  . INFLUENZA VACCINE  Completed  . DEXA SCAN  Completed  . PNA vac Low Risk Adult  Completed     Discussed health benefits of physical activity, and encouraged her to engage in regular exercise appropriate for her age and condition.    1. Annual physical exam Normal physical exam today. Will check labs as below and f/u pending lab results. If labs are stable and WNL she will not need to have these rechecked for one year at her next annual physical exam. She is to call the office in the meantime if she has any acute issue, questions or concerns.  2. Postoperative hypothyroidism Stable. Continue levothyroxine 31mcg (takes half tab- 37.37mcg) and 45mcg daily (total of 67.67mcg). Will check labs as below and f/u pending results. - CBC w/Diff/Platelet - Comprehensive Metabolic Panel (CMET) - TSH  3.  Avitaminosis D Stable on OTC dosing, 2000 IU daily. Will check labs as below and f/u pending results. - CBC w/Diff/Platelet - Vitamin D (25 hydroxy)  4. Hypertriglyceridemia Diet controlled. Will check labs as below and f/u pending results. - CBC w/Diff/Platelet - Comprehensive Metabolic Panel (CMET) - Lipid Profile - HgB A1c  5. Late onset Alzheimer's disease without behavioral disturbance (HCC) Stable on Namenda XR BID and  Donepezil 10mg  daily at bedtime. Followed by Neuro.  - CBC w/Diff/Platelet  6. Barrett's esophagus with low grade dysplasia Stable. Followed by Dr. Bary Castilla. Most recent EGD was 10/30/16. Repeat in 2 years.  - CBC w/Diff/Platelet - Comprehensive Metabolic Panel (CMET)  7. Class 1 obesity due to excess calories with serious comorbidity and body mass index (BMI) of 32.0 to 32.9 in adult Counseled patient on healthy lifestyle modifications including dieting and exercise.  - CBC w/Diff/Platelet - Comprehensive Metabolic Panel (CMET) - Lipid Profile - HgB A1c  8. Knee buckling, left Has fallen twice due to knee giving way. Will get imaging as below. May benefit from bracing if one can fit.  - DG Knee Complete 4 Views Left; Future  ------------------------------------------------------------------------------------------------------------    Mar Daring, PA-C  [facility]

## 2018-04-01 NOTE — Progress Notes (Deleted)
       Patient: Kathryn Brooks Female    DOB: 1935-10-31   82 y.o.   MRN: [id number] Visit Date: 04/01/2018  Today's Provider: Mar Daring, PA-C   No chief complaint on file.  Subjective:    HPI     No Known Allergies   Current Outpatient Medications:  .  aspirin 81 MG tablet, Take 81 mg by mouth daily., Disp: , Rfl:  .  cholecalciferol (VITAMIN D) 1000 units tablet, Take 2,000 Units by mouth daily. , Disp: , Rfl:  .  donepezil (ARICEPT) 10 MG tablet, Take 10 mg by mouth at bedtime. , Disp: , Rfl:  .  levothyroxine (SYNTHROID, LEVOTHROID) 25 MCG tablet, Take 25 mcg by mouth daily before breakfast. Take with 42mcg tab, Disp: , Rfl: 0 .  levothyroxine (SYNTHROID, LEVOTHROID) 75 MCG tablet, Take 37.5 mcg by mouth daily before breakfast. With 12.41mcg, Disp: , Rfl: 3 .  Memantine HCl (NAMENDA XR PO), Take 1 tablet by mouth 2 (two) times daily. , Disp: , Rfl:  .  omeprazole (PRILOSEC) 40 MG capsule, Take 1 capsule by mouth daily as needed. Acid reflux, Disp: , Rfl: 0 .  vitamin B-12 (CYANOCOBALAMIN) 1000 MCG tablet, Take 1,000 mcg by mouth daily. , Disp: , Rfl:   Review of Systems  Social History   Tobacco Use  . Smoking status: Never Smoker  . Smokeless tobacco: Never Used  Substance Use Topics  . Alcohol use: No   Objective:   There were no vitals taken for this visit. There were no vitals filed for this visit.   Physical Exam      Assessment & Plan:           Mar Daring, PA-C  [facility]

## 2018-04-02 LAB — COMPREHENSIVE METABOLIC PANEL
ALBUMIN: 4.1 g/dL (ref 3.5–4.7)
ALK PHOS: 86 IU/L (ref 39–117)
ALT: 13 IU/L (ref 0–32)
AST: 15 IU/L (ref 0–40)
Albumin/Globulin Ratio: 1.5 (ref 1.2–2.2)
BUN/Creatinine Ratio: 15 (ref 12–28)
BUN: 15 mg/dL (ref 8–27)
Bilirubin Total: 0.3 mg/dL (ref 0.0–1.2)
CO2: 23 mmol/L (ref 20–29)
CREATININE: 0.98 mg/dL (ref 0.57–1.00)
Calcium: 9.2 mg/dL (ref 8.7–10.3)
Chloride: 102 mmol/L (ref 96–106)
GFR calc Af Amer: 63 mL/min/{1.73_m2} (ref >59)
GFR calc non Af Amer: 55 mL/min/{1.73_m2} — ABNORMAL LOW (ref >59)
GLUCOSE: 90 mg/dL (ref 65–99)
Globulin, Total: 2.7 g/dL (ref 1.5–4.5)
Potassium: 4 mmol/L (ref 3.5–5.2)
Sodium: 142 mmol/L (ref 134–144)
Total Protein: 6.8 g/dL (ref 6.0–8.5)

## 2018-04-02 LAB — CBC WITH DIFFERENTIAL/PLATELET
BASOS ABS: 0.1 10*3/uL (ref 0.0–0.2)
Basos: 1 %
EOS (ABSOLUTE): 0.1 10*3/uL (ref 0.0–0.4)
Eos: 2 %
HEMOGLOBIN: 13.8 g/dL (ref 11.1–15.9)
Hematocrit: 40.4 % (ref 34.0–46.6)
IMMATURE GRANULOCYTES: 0 %
Immature Grans (Abs): 0 10*3/uL (ref 0.0–0.1)
Lymphocytes Absolute: 1.4 10*3/uL (ref 0.7–3.1)
Lymphs: 24 %
MCH: 30 pg (ref 26.6–33.0)
MCHC: 34.2 g/dL (ref 31.5–35.7)
MCV: 88 fL (ref 79–97)
MONOCYTES: 8 %
Monocytes Absolute: 0.5 10*3/uL (ref 0.1–0.9)
NEUTROS PCT: 65 %
Neutrophils Absolute: 3.9 10*3/uL (ref 1.4–7.0)
Platelets: 249 10*3/uL (ref 150–450)
RBC: 4.6 x10E6/uL (ref 3.77–5.28)
RDW: 12.8 % (ref 12.3–15.4)
WBC: 6 10*3/uL (ref 3.4–10.8)

## 2018-04-02 LAB — TSH: TSH: 2.46 u[IU]/mL (ref 0.450–4.500)

## 2018-04-02 LAB — HEMOGLOBIN A1C
ESTIMATED AVERAGE GLUCOSE: 103 mg/dL
Hgb A1c MFr Bld: 5.2 % (ref 4.8–5.6)

## 2018-04-02 LAB — LIPID PANEL
CHOL/HDL RATIO: 3.4 ratio (ref 0.0–4.4)
CHOLESTEROL TOTAL: 226 mg/dL — AB (ref 100–199)
HDL: 66 mg/dL (ref >39)
LDL Calculated: 139 mg/dL — ABNORMAL HIGH (ref 0–99)
Triglycerides: 103 mg/dL (ref 0–149)
VLDL Cholesterol Cal: 21 mg/dL (ref 5–40)

## 2018-04-02 LAB — VITAMIN D 25 HYDROXY (VIT D DEFICIENCY, FRACTURES): Vit D, 25-Hydroxy: 35.5 ng/mL (ref 30.0–100.0)

## 2018-04-03 ENCOUNTER — Telehealth: Payer: Self-pay

## 2018-04-03 NOTE — Telephone Encounter (Signed)
-----   Message from Mar Daring, [place] sent at 04/03/2018  8:23 AM EST ----- All labs are within normal limits and stable.  Thanks! -JB

## 2018-04-03 NOTE — Telephone Encounter (Signed)
Roger patient's husband advised as directed below. Patient with dementia.

## 2018-04-23 IMAGING — CT CT HEAD W/O CM
3 series · 16 of 47 positions shown, 19 images · non-contrast
Comparison: MRI 07/31/2015

CLINICAL DATA: Headache

EXAM:
CT HEAD WITHOUT CONTRAST
TECHNIQUE: Contiguous axial images were obtained from the base of the skull
through the vertex without intravenous contrast.

[Series 2: head wo · axial · 0.38mm/px · z∈[-62,+63]mm · 10 of 30 slices shown, 13 images]
[im 3/30  brain]
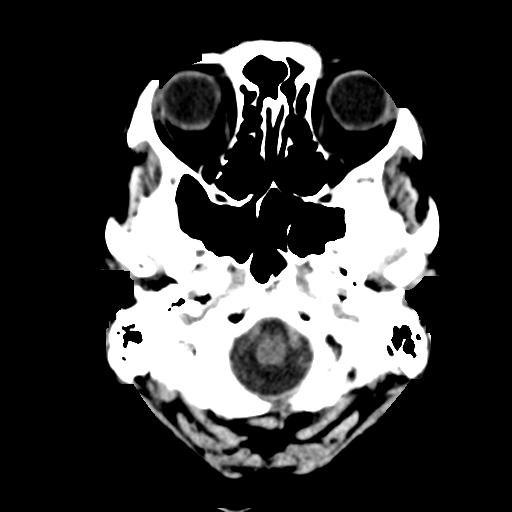
[im 3/30  bone]
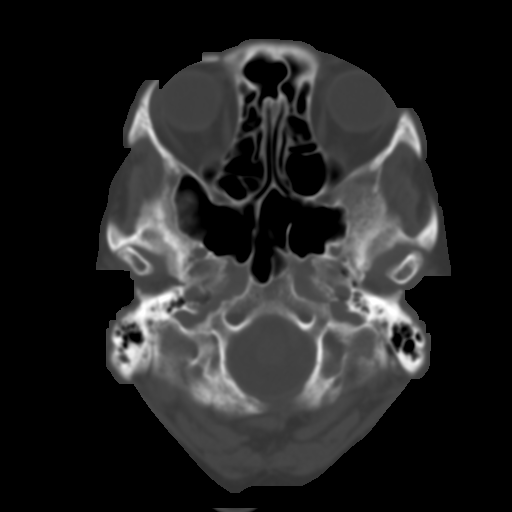
[im 6/30  brain]
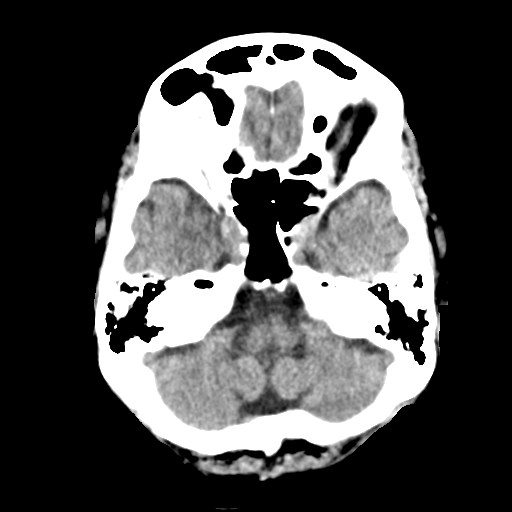
[im 9/30  brain]
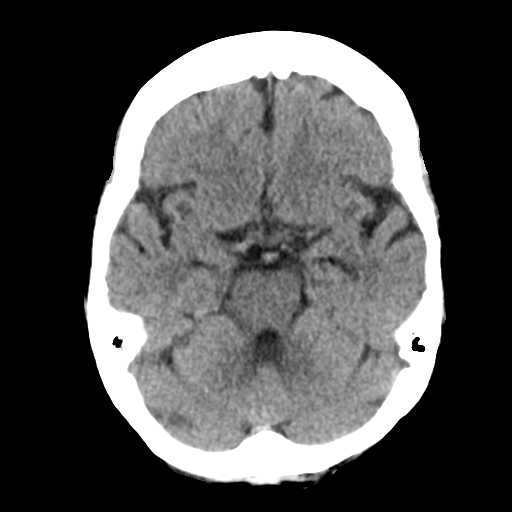
[im 11/30  brain]
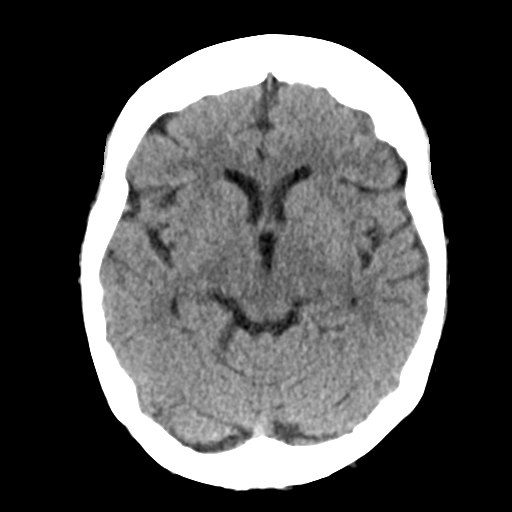
[im 14/30  brain]
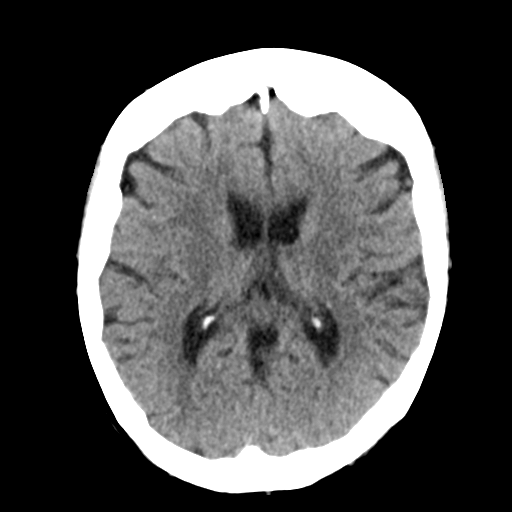
[im 14/30  bone]
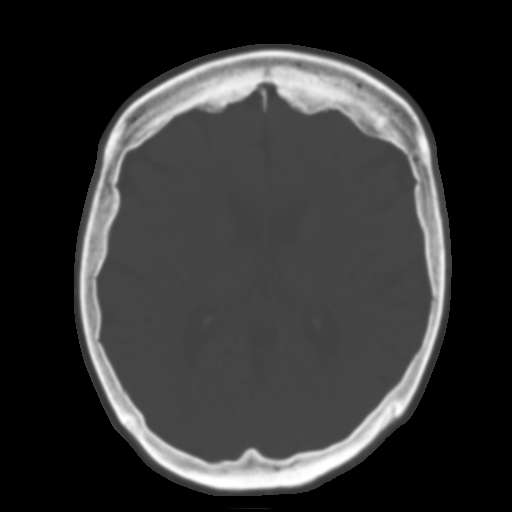
[im 17/30  brain]
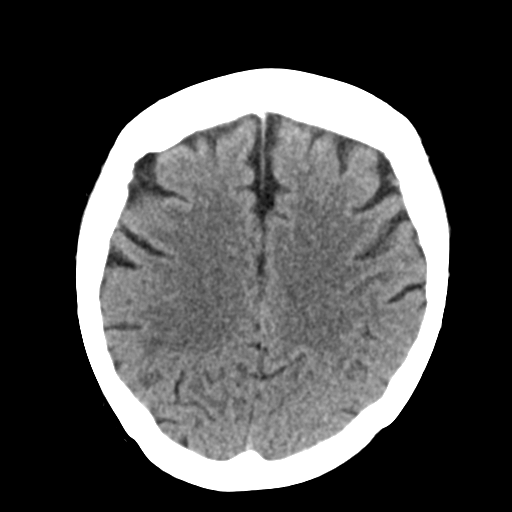
[im 20/30  brain]
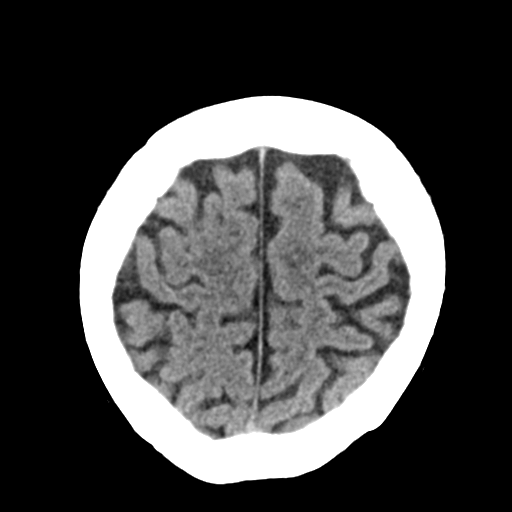
[im 23/30  brain]
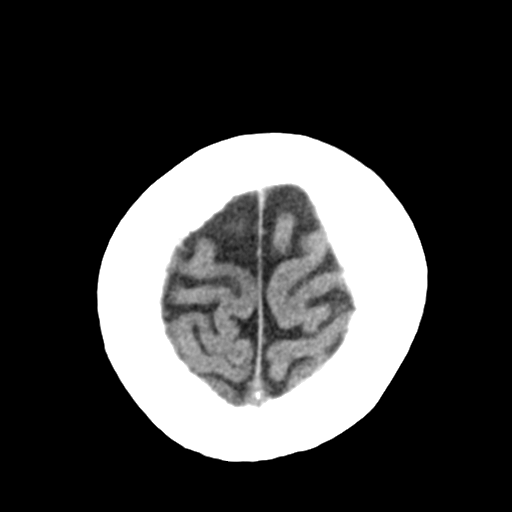
[im 25/30  brain]
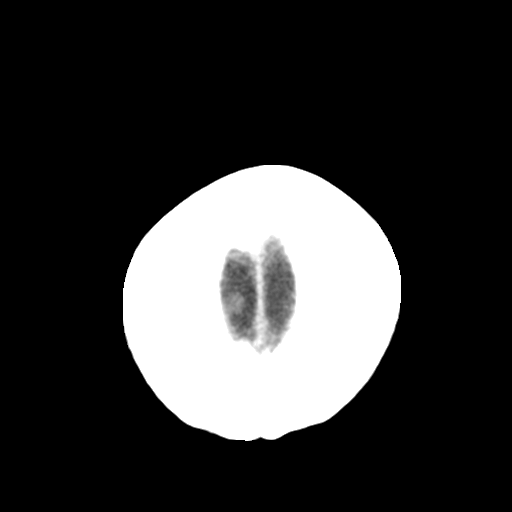
[im 25/30  bone]
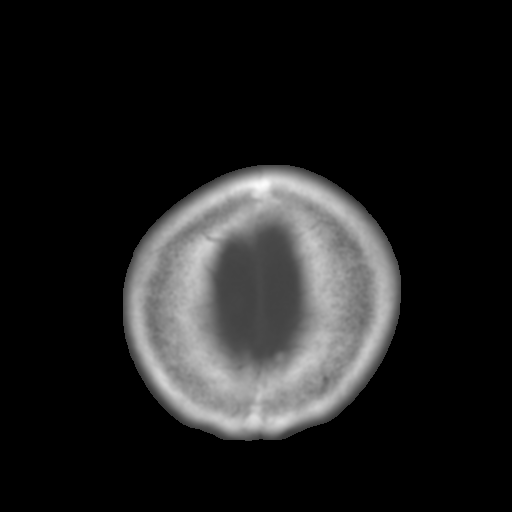
[im 28/30  brain]
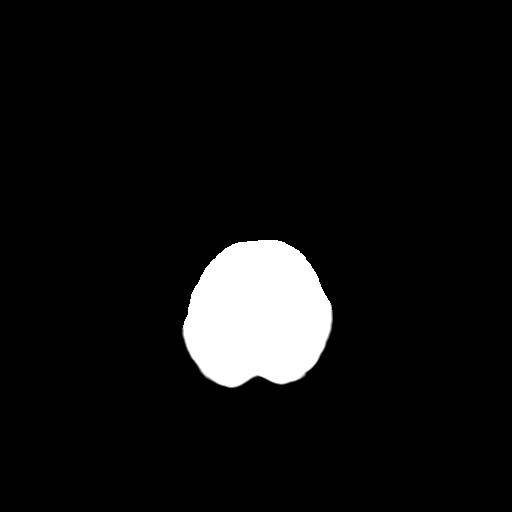

[Series 4: coronal soft tissue · coronal · 0.29mm/px · 3 of 60 slices shown]
[im 20/60  brain]
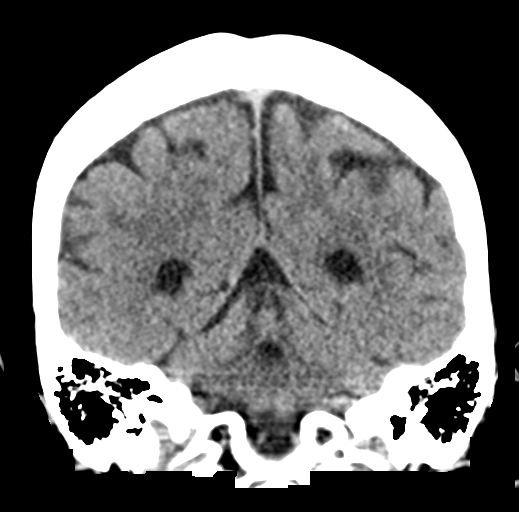
[im 27/60  brain]
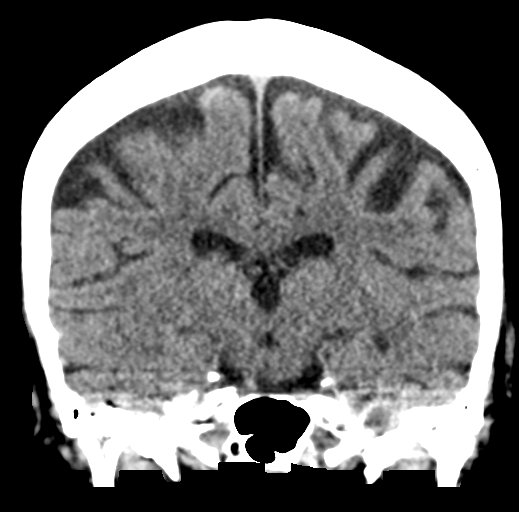
[im 33/60  brain]
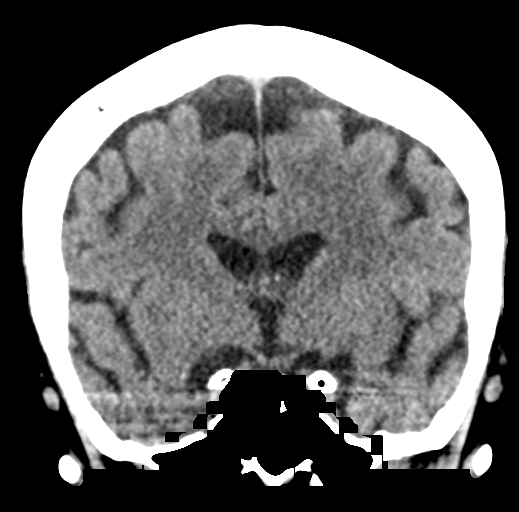

[Series 5: sagittal soft tissue · sagittal · 0.29mm/px · 3 of 52 slices shown]
[im 18/52  brain]
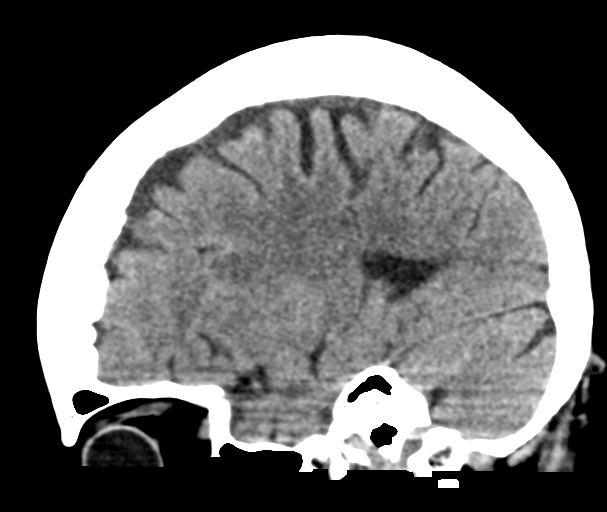
[im 26/52  brain]
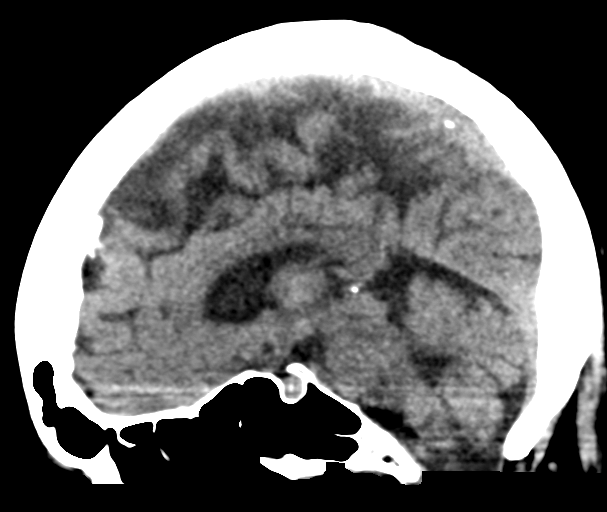
[im 35/52  brain]
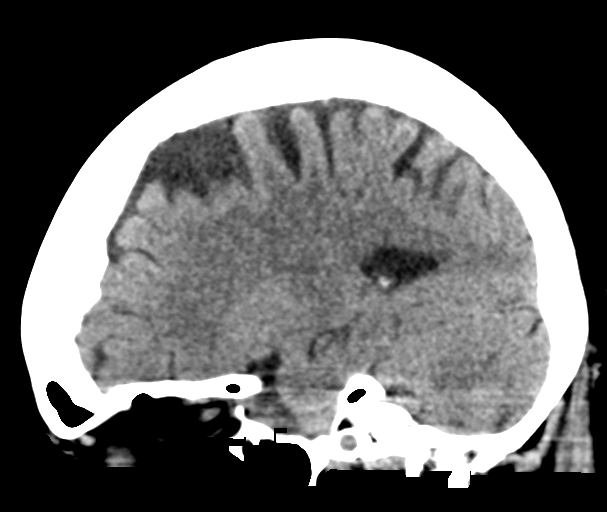

[16 of 47 positions shown; findings below may reference images not displayed]

FINDINGS: Brain: Cerebral volume normal for age and unchanged. Negative for
hydrocephalus. Negative for infarct, hemorrhage, or mass lesion. No
shift of the midline structures.

Vascular: No hyperdense vessel or unexpected calcification.

Skull: Negative

Sinuses/Orbits: Negative

Other: None
IMPRESSION: Normal for age.

## 2018-09-30 ENCOUNTER — Ambulatory Visit: Payer: Self-pay | Admitting: Physician Assistant

## 2018-10-13 NOTE — Progress Notes (Signed)
Patient: Kathryn Brooks Female    DOB: 26-Jun-1935   83 y.o.   MRN: [id number] Visit Date: 10/14/2018  Today's Provider: Mar Daring, PA-C   Chief Complaint  Patient presents with  . Follow-up   Subjective:     HPI   Postoperative hypothyroidism From 04/02/2019-Stable. Labs checked, no changes.  Avitaminosis D From 04/01/2019-Stable on OTC dosing, 2000 IU daily. labs checked, no changes.  Hypertriglyceridemia From 04/01/2019-labs checked, no changes.   Late onset Alzheimer's disease without behavioral disturbance ([place]) From 04/01/2019-Stable on Namenda XR BID and Donepezil 10mg  daily at bedtime. Followed by Neuro.   Barrett's esophagus with low grade dysplasia From 04/01/2019-Stable. Followed by Dr. Bary Castilla. Most recent EGD was 10/30/16. Repeat in 2 years.   Class 1 obesity due to excess calories with serious comorbidity and body mass index (BMI) of 32.0 to 32.9 in adult From 04/01/2019-Counseled patient on healthy lifestyle modifications including dieting and exercise.    No Known Allergies   Current Outpatient Medications:  .  aspirin 81 MG tablet, Take 81 mg by mouth daily., Disp: , Rfl:  .  donepezil (ARICEPT) 10 MG tablet, Take 10 mg by mouth at bedtime. , Disp: , Rfl:  .  levothyroxine (SYNTHROID, LEVOTHROID) 25 MCG tablet, Take 25 mcg by mouth daily before breakfast. Take with 26mcg tab, Disp: , Rfl: 0 .  levothyroxine (SYNTHROID, LEVOTHROID) 75 MCG tablet, Take 37.5 mcg by mouth daily before breakfast. With 12.36mcg, Disp: , Rfl: 3 .  Memantine HCl (NAMENDA XR PO), Take 1 tablet by mouth 2 (two) times daily. , Disp: , Rfl:  .  omeprazole (PRILOSEC) 40 MG capsule, Take 1 capsule by mouth daily as needed. Acid reflux, Disp: , Rfl: 0 .  cholecalciferol (VITAMIN D) 1000 units tablet, Take 2,000 Units by mouth daily. , Disp: , Rfl:  .  vitamin B-12 (CYANOCOBALAMIN) 1000 MCG tablet, Take 1,000 mcg by mouth daily. , Disp: , Rfl:   Review of Systems   Constitutional: Negative for appetite change, chills, fatigue and fever.  Respiratory: Positive for shortness of breath ("sometimes"; husband reports stable). Negative for chest tightness.   Cardiovascular: Negative for chest pain and palpitations.  Gastrointestinal: Negative for abdominal pain, nausea and vomiting.  Neurological: Negative for dizziness and weakness.    Social History   Tobacco Use  . Smoking status: Never Smoker  . Smokeless tobacco: Never Used  Substance Use Topics  . Alcohol use: No      Objective:   BP 130/82 (BP Location: Left Arm, Patient Position: Sitting, Cuff Size: Large)   Pulse 67   Resp 16   Wt 225 lb (102.1 kg)   BMI 33.23 kg/m  Vitals:   10/14/18 0817  BP: 130/82  Pulse: 67  Resp: 16  Weight: 225 lb (102.1 kg)     Physical Exam Vitals signs reviewed.  Constitutional:      General: She is not in acute distress.    Appearance: Normal appearance. She is well-developed. She is obese. She is not ill-appearing or diaphoretic.  HENT:     Head: Normocephalic and atraumatic.     Right Ear: External ear normal.     Left Ear: External ear normal.     Nose: Nose normal.     Mouth/Throat:     Mouth: Mucous membranes are moist.  Eyes:     General: No scleral icterus.    Extraocular Movements: Extraocular movements intact.     Pupils: Pupils are  equal, round, and reactive to light.  Neck:     Musculoskeletal: Normal range of motion and neck supple.     Thyroid: No thyromegaly.     Vascular: No JVD.     Trachea: No tracheal deviation.  Cardiovascular:     Rate and Rhythm: Normal rate and regular rhythm.     Pulses: Normal pulses.     Heart sounds: Normal heart sounds. No murmur. No friction rub. No gallop.   Pulmonary:     Effort: Pulmonary effort is normal. No respiratory distress.     Breath sounds: Normal breath sounds. No wheezing or rales.  Lymphadenopathy:     Cervical: No cervical adenopathy.  Skin:    Capillary Refill: Capillary  refill takes less than 2 seconds.  Neurological:     General: No focal deficit present.     Mental Status: She is alert. Mental status is at baseline.     Motor: No weakness.     Gait: Gait normal.  Psychiatric:        Mood and Affect: Mood normal.        Behavior: Behavior normal.        Thought Content: Thought content normal.        Judgment: Judgment normal.         Assessment & Plan    1. Late onset Alzheimer's disease without behavioral disturbance (HCC) Currently stable. Continue Namenda XR and donepezil daily. I will see her back in 6 months for her CPE and MMSE.   2. Barrett's esophagus with low grade dysplasia Scheduled to see Dr. Bary Castilla on 11/10/18. Due for EGD this year but advised it may be postponed slightly due to Covid-19.   3. Postoperative hypothyroidism Stable on 62.20mcg daily (takes 1/2 48mcg tab and a 24mcg tab).      Mar Daring, PA-C  [facility]

## 2018-10-14 ENCOUNTER — Other Ambulatory Visit: Payer: Self-pay

## 2018-10-14 ENCOUNTER — Ambulatory Visit: Payer: Medicare Other | Admitting: Physician Assistant

## 2018-10-14 ENCOUNTER — Encounter: Payer: Self-pay | Admitting: Physician Assistant

## 2018-10-14 VITALS — BP 130/82 | HR 67 | Resp 16 | Wt 225.0 lb

## 2018-10-14 DIAGNOSIS — G301 Alzheimer's disease with late onset: Secondary | ICD-10-CM

## 2018-10-14 DIAGNOSIS — K22710 Barrett's esophagus with low grade dysplasia: Secondary | ICD-10-CM

## 2018-10-14 DIAGNOSIS — E890 Postprocedural hypothyroidism: Secondary | ICD-10-CM

## 2018-10-14 DIAGNOSIS — F0280 Dementia in other diseases classified elsewhere without behavioral disturbance: Secondary | ICD-10-CM

## 2018-11-10 ENCOUNTER — Encounter: Payer: Self-pay | Admitting: General Surgery

## 2018-11-10 ENCOUNTER — Ambulatory Visit: Payer: Medicare Other | Admitting: General Surgery

## 2018-11-10 ENCOUNTER — Other Ambulatory Visit: Payer: Self-pay

## 2018-11-10 VITALS — BP 152/84 | HR 81 | Temp 97.3°F | Resp 16 | Ht 69.0 in | Wt 221.8 lb

## 2018-11-10 DIAGNOSIS — K22710 Barrett's esophagus with low grade dysplasia: Secondary | ICD-10-CM

## 2018-11-10 DIAGNOSIS — Z800 Family history of malignant neoplasm of digestive organs: Secondary | ICD-10-CM

## 2018-11-10 MED ORDER — POLYETHYLENE GLYCOL 3350 17 GM/SCOOP PO POWD: ORAL | 0 refills | Status: DC

## 2018-11-10 NOTE — Progress Notes (Signed)
Patient ID: Kathryn Brooks, female   DOB: [birth year], 83 y.o.   MRN: [id number]  Chief Complaint  Patient presents with  . Follow-up    2 Yr f/u endoscopy    HPI Kathryn Brooks is a 83 y.o. female.  Here today for 2 year follow up consult for Endoscopy. Patient states she has decreased energy and has mild, early dementia. HPI  Past Medical History:  Diagnosis Date  . Barrett's esophagus [id number]   biopsies were completed at 34,32,30,and 26 cm from the incisors. gastric cardia type mucosa was identified from 30-34 cm. Squamocolumnar mucosa with reflux gastroesophagitis and Barrett's esophagus was noted at 26 cm. No atypia or dysplasia was identified. The previous biopsies of December 18, 2010 at 25,30,35 cm showed similar changes. There has been no progression of the Barrett's(long segment) and  . Barrett's esophagus determined by biopsy 2012   No atypia on March 2014 biopsy.  . Blood in stool [id number]  . Breast screening, unspecified 2013  . GERD (gastroesophageal reflux disease) [id number]  . Obesity, unspecified 2013  . Special screening for malignant neoplasms, colon 2013  . Thyroid disease     Past Surgical History:  Procedure Laterality Date  . COLONOSCOPY N/A 2005,2012   heme positive stools  . ESOPHAGOGASTRODUODENOSCOPY N/A 09/28/2014   Procedure: ESOPHAGOGASTRODUODENOSCOPY (EGD);  Surgeon: Robert Bellow, MD;  Location[facility] ENDOSCOPY;  Service: Endoscopy;  Laterality: N/A;  . ESOPHAGOGASTRODUODENOSCOPY (EGD) WITH PROPOFOL N/A 10/30/2016   Procedure: ESOPHAGOGASTRODUODENOSCOPY (EGD) WITH PROPOFOL;  Surgeon: Robert Bellow, MD;  Location: [facility] ENDOSCOPY;  Service: Endoscopy;  Laterality: N/A;  . THYROIDECTOMY Right 2015  . TUBAL LIGATION    . UPPER GASTROINTESTINAL ENDOSCOPY N/A 2012   chronic grastitis, Barrett's esophagus    Family History  Problem Relation Age of Onset  . Colon cancer Mother        07/19/2004  . Cancer Mother     Social History Social History    Tobacco Use  . Smoking status: Never Smoker  . Smokeless tobacco: Never Used  Substance Use Topics  . Alcohol use: No  . Drug use: No    No Known Allergies  Current Outpatient Medications  Medication Sig Dispense Refill  . aspirin 81 MG tablet Take 81 mg by mouth daily.    . cholecalciferol (VITAMIN D) 1000 units tablet Take 2,000 Units by mouth daily.     Marland Kitchen donepezil (ARICEPT) 10 MG tablet Take 10 mg by mouth at bedtime.     Marland Kitchen levothyroxine (SYNTHROID, LEVOTHROID) 25 MCG tablet Take 25 mcg by mouth daily before breakfast. Take with 87mcg tab  0  . levothyroxine (SYNTHROID, LEVOTHROID) 75 MCG tablet Take 37.5 mcg by mouth daily before breakfast. With 12.47mcg  3  . omeprazole (PRILOSEC) 40 MG capsule Take 1 capsule by mouth daily as needed. Acid reflux  0  . Memantine HCl (NAMENDA XR PO) Take 1 tablet by mouth 2 (two) times daily.     . polyethylene glycol powder (GLYCOLAX/MIRALAX) 17 GM/SCOOP powder 255 grams one bottle for colonoscopy prep 255 g 0  . vitamin B-12 (CYANOCOBALAMIN) 1000 MCG tablet Take 1,000 mcg by mouth daily.      No current facility-administered medications for this visit.     Review of Systems Review of Systems  Constitutional: Negative.   Respiratory: Negative.   Cardiovascular: Negative.     Blood pressure (!) 152/84, pulse 81, temperature (!) 97.3 F (36.3 C), temperature source Temporal, resp. rate 16, height 5\' 9"  (1.753  m), weight 221 lb 12.8 oz (100.6 kg), SpO2 94 %.  Physical Exam Physical Exam Constitutional:      Appearance: She is well-developed.  Eyes:     Conjunctiva/sclera: Conjunctivae normal.  Neck:     Musculoskeletal: Normal range of motion.  Cardiovascular:     Rate and Rhythm: Normal rate and regular rhythm.     Heart sounds: Normal heart sounds.  Pulmonary:     Effort: Pulmonary effort is normal.     Breath sounds: Normal breath sounds.  Chest:     Breasts:        Right: No inverted nipple, mass, nipple discharge, skin  change or tenderness.        Left: No inverted nipple, mass, nipple discharge, skin change or tenderness.  Skin:    General: Skin is warm and dry.  Neurological:     Mental Status: She is alert and oriented to person, place, and time.     Data Reviewed June 2018 biopsy results: DIAGNOSIS:  A. STOMACH POLYPS X 3; COLD BIOPSY:  - POLYPOID MUCOSAL FRAGMENTS WITH PROTON PUMP INHIBITOR EFFECT.  - ONE POSSIBLE EARLY FUNDIC GLAND POLYP.  - NEGATIVE FOR HELICOBACTER PYLORI, ACTIVE INFLAMMATION, INTESTINAL  METAPLASIA, DYSPLASIA, AND MALIGNANCY.   B. ESOPHAGUS, 37 CM; COLD BIOPSY:  - BARRETT MUCOSA WITH MILD CHRONIC INFLAMMATION.  - NEGATIVE FOR DYSPLASIA AND MALIGNANCY.   C. ESOPHAGUS, 35 CM; COLD BIOPSY:  - BARRETT MUCOSA WITH MILD CHRONIC INFLAMMATION.  - NEGATIVE FOR DYSPLASIA AND MALIGNANCY.   D. ESOPHAGUS, 25 CM; COLD BIOPSY:  - BARRETT MUCOSA AND A TINY AREA OF SQUAMOUS EPITHELIUM IN ONE PIECE,  WITH MILD CHRONIC INFLAMMATION.  - NEGATIVE FOR DYSPLASIA AND MALIGNANCY.    Colonoscopy dated December 18, 2010 for heme positive stools was notable for a focal area of irritated mucosa in the rectum. Part D: RECTAL @ 10 CM COLD BIOPSIES:  FRAGMENTS OF INFLAMED COLORECTAL MUCOSA WITH FOCAL ACTIVE PROCTITIS.  FOCAL MUCOSAL HEMORRHAGE AND EDEMA PRESENT. BENIGN LYMPHOID  AGGREGATE NOTED. NEGATIVE FOR DYSPLASIA AND MALIGNANCY.  NO GRANULOMAS OR SUBEPITHELIAL COLLAGEN DEPOSITION OBSERVED.    Assessment Candidate for follow-up upper endoscopy based on past identification of Barrett's epithelium.  Candidate for colon screening based on family history.  Plan  Options for colon screening based on her mother's colon cancer in her 61s was reviewed: Observation, Cologuard or colonoscopy.  Reviewing the pros and cons of all options she is elected to proceed to colonoscopy.  Colonoscopy with possible biopsy/polypectomy prn: Information regarding the procedure, including its potential risks and  complications (including but not limited to perforation of the bowel, which may require emergency surgery to repair, and bleeding) was verbally given to the patient. Educational information regarding lower intestinal endoscopy was given to the patient. Written instructions for how to complete the bowel prep using Miralax were provided. The importance of drinking ample fluids to avoid dehydration as a result of the prep emphasized.  Please call if you have questions or concerns. We will schedule the Endoscopy/Colonoscopy. Please follow the bowel prep instructions provided.       HPI, Physical Exam, Assessment and Plan have been scribed under the direction and in the presence of Robert Bellow, MD. Kathryn Brooks, CMA  I have completed the exam and reviewed the above documentation for accuracy and completeness.  I agree with the above.  Haematologist has been used and any errors in dictation or transcription are unintentional.  Hervey Ard, M.D., F.A.C.S.  Robert Bellow  11/11/2018, 7:36 PM  Patient has been scheduled for an upper endoscopy and colonoscopy on 11-18-18 at [facility]. Miralax prescription has been sent in to the patient's pharmacy today. Colonoscopy instructions have been reviewed with the patient. This patient is aware to call the office if they have further questions.   The patient is aware to have COVID-19 testing done on 11-13-18 at the [facility] drive thru ([address] [place]) between 10:30 am and 12:30 pm. She is aware to isolate after, have no visitors, wash hands frequently, and avoid touching face.   Kathryn Brooks, CMA

## 2018-11-10 NOTE — Patient Instructions (Addendum)
Please call if you have questions or concerns. We will schedule the Endoscopy/Colonoscopy. Please follow the bowel prep instructions provided.

## 2018-11-13 ENCOUNTER — Other Ambulatory Visit: Payer: Self-pay

## 2018-11-13 ENCOUNTER — Other Ambulatory Visit
Admission: RE | Admit: 2018-11-13 | Discharge: 2018-11-13 | Disposition: A | Discharge status: Home / Self Care | Payer: Medicare Other | Source: Ambulatory Visit | Attending: General Surgery | Admitting: General Surgery

## 2018-11-13 DIAGNOSIS — Z01812 Encounter for preprocedural laboratory examination: Secondary | ICD-10-CM | POA: Diagnosis present

## 2018-11-13 DIAGNOSIS — Z1159 Encounter for screening for other viral diseases: Secondary | ICD-10-CM | POA: Diagnosis not present

## 2018-11-13 LAB — SARS CORONAVIRUS 2 (TAT 6-24 HRS): SARS Coronavirus 2: NEGATIVE

## 2018-11-17 ENCOUNTER — Encounter: Payer: Self-pay | Admitting: *Deleted

## 2018-11-18 ENCOUNTER — Encounter: Payer: Self-pay | Admitting: *Deleted

## 2018-11-18 ENCOUNTER — Other Ambulatory Visit: Payer: Self-pay

## 2018-11-18 ENCOUNTER — Ambulatory Visit
Admission: RE | Admit: 2018-11-18 | Discharge: 2018-11-18 | Disposition: A | Discharge status: Home / Self Care | Payer: Medicare Other | Attending: General Surgery | Admitting: General Surgery

## 2018-11-18 ENCOUNTER — Ambulatory Visit: Payer: Medicare Other | Admitting: Certified Registered Nurse Anesthetist

## 2018-11-18 ENCOUNTER — Encounter
Admission: RE | Disposition: A | Discharge status: Home / Self Care | Payer: Self-pay | Source: Home / Self Care | Attending: General Surgery

## 2018-11-18 DIAGNOSIS — K2270 Barrett's esophagus without dysplasia: Secondary | ICD-10-CM | POA: Diagnosis not present

## 2018-11-18 DIAGNOSIS — K219 Gastro-esophageal reflux disease without esophagitis: Secondary | ICD-10-CM | POA: Insufficient documentation

## 2018-11-18 DIAGNOSIS — Z79899 Other long term (current) drug therapy: Secondary | ICD-10-CM | POA: Insufficient documentation

## 2018-11-18 DIAGNOSIS — K317 Polyp of stomach and duodenum: Secondary | ICD-10-CM | POA: Diagnosis not present

## 2018-11-18 DIAGNOSIS — F0390 Unspecified dementia without behavioral disturbance: Secondary | ICD-10-CM | POA: Insufficient documentation

## 2018-11-18 DIAGNOSIS — Z800 Family history of malignant neoplasm of digestive organs: Secondary | ICD-10-CM | POA: Insufficient documentation

## 2018-11-18 DIAGNOSIS — Z7982 Long term (current) use of aspirin: Secondary | ICD-10-CM | POA: Diagnosis not present

## 2018-11-18 DIAGNOSIS — Z6832 Body mass index (BMI) 32.0-32.9, adult: Secondary | ICD-10-CM | POA: Diagnosis not present

## 2018-11-18 DIAGNOSIS — Z1211 Encounter for screening for malignant neoplasm of colon: Secondary | ICD-10-CM | POA: Diagnosis not present

## 2018-11-18 DIAGNOSIS — D124 Benign neoplasm of descending colon: Secondary | ICD-10-CM | POA: Diagnosis not present

## 2018-11-18 DIAGNOSIS — E039 Hypothyroidism, unspecified: Secondary | ICD-10-CM | POA: Diagnosis not present

## 2018-11-18 DIAGNOSIS — D123 Benign neoplasm of transverse colon: Secondary | ICD-10-CM | POA: Insufficient documentation

## 2018-11-18 DIAGNOSIS — E669 Obesity, unspecified: Secondary | ICD-10-CM | POA: Insufficient documentation

## 2018-11-18 DIAGNOSIS — K529 Noninfective gastroenteritis and colitis, unspecified: Secondary | ICD-10-CM | POA: Diagnosis not present

## 2018-11-18 DIAGNOSIS — K635 Polyp of colon: Secondary | ICD-10-CM | POA: Diagnosis not present

## 2018-11-18 DIAGNOSIS — K6389 Other specified diseases of intestine: Secondary | ICD-10-CM | POA: Diagnosis not present

## 2018-11-18 HISTORY — PX: ESOPHAGOGASTRODUODENOSCOPY (EGD) WITH PROPOFOL: SHX5813

## 2018-11-18 HISTORY — PX: COLONOSCOPY WITH PROPOFOL: SHX5780

## 2018-11-18 HISTORY — DX: Unspecified dementia, unspecified severity, without behavioral disturbance, psychotic disturbance, mood disturbance, and anxiety: F03.90

## 2018-11-18 SURGERY — COLONOSCOPY WITH PROPOFOL
Anesthesia: General

## 2018-11-18 MED ORDER — PHENYLEPHRINE HCL (PRESSORS) 10 MG/ML IV SOLN
INTRAVENOUS | Status: DC | PRN
  Administered 2018-11-18: 50 ug via INTRAVENOUS

## 2018-11-18 MED ORDER — PROPOFOL 10 MG/ML IV BOLUS
INTRAVENOUS | Status: DC | PRN
  Administered 2018-11-18: 10 mg via INTRAVENOUS
  Administered 2018-11-18: 40 mg via INTRAVENOUS
  Administered 2018-11-18: 10 mg via INTRAVENOUS

## 2018-11-18 MED ORDER — GLYCOPYRROLATE 0.2 MG/ML IJ SOLN
INTRAMUSCULAR | Status: DC | PRN
  Administered 2018-11-18: 0.2 mg via INTRAVENOUS

## 2018-11-18 MED ORDER — SODIUM CHLORIDE 0.9 % IV SOLN
INTRAVENOUS | Status: DC
  Administered 2018-11-18: 08:00:00 via INTRAVENOUS

## 2018-11-18 MED ORDER — PROPOFOL 500 MG/50ML IV EMUL
INTRAVENOUS | Status: DC | PRN
  Administered 2018-11-18: 130 ug/kg/min via INTRAVENOUS

## 2018-11-18 MED ORDER — LIDOCAINE HCL (CARDIAC) PF 100 MG/5ML IV SOSY
PREFILLED_SYRINGE | INTRAVENOUS | Status: DC | PRN
  Administered 2018-11-18: 100 mg via INTRAVENOUS

## 2018-11-18 MED ORDER — LIDOCAINE HCL (PF) 2 % IJ SOLN
INTRAMUSCULAR | Status: AC
  Filled 2018-11-18: qty 10

## 2018-11-18 NOTE — Anesthesia Post-op Follow-up Note (Signed)
Anesthesia QCDR form completed.        

## 2018-11-18 NOTE — Op Note (Signed)
[facility] Gastroenterology Patient Name: Kathryn Brooks Procedure Date: 11/18/2018 8:27 AM MRN: [id number] Account #: [id number] Date of Birth: Nov 02, 1935 Admit Type: Outpatient Age: 83 Room: [facility] ENDO ROOM 1 Gender: Female Note Status: Finalized Procedure:            Upper GI endoscopy Indications:          Surveillance for malignancy due to personal history of                        Barrett's esophagus Providers:            Robert Bellow, MD Referring MD:         Mar Daring (Referring MD) Medicines:            Monitored Anesthesia Care Complications:        No immediate complications. Procedure:            Pre-Anesthesia Assessment:                       - Prior to the procedure, a History and Physical was                        performed, and patient medications, allergies and                        sensitivities were reviewed. The patient's tolerance of                        previous anesthesia was reviewed.                       - The risks and benefits of the procedure and the                        sedation options and risks were discussed with the                        patient. All questions were answered and informed                        consent was obtained.                       After obtaining informed consent, the endoscope was                        passed under direct vision. Throughout the procedure,                        the patient's blood pressure, pulse, and oxygen                        saturations were monitored continuously. The Endoscope                        was introduced through the mouth, and advanced to the                        second part of duodenum. The upper GI endoscopy was  accomplished without difficulty. The patient tolerated                        the procedure well. Findings:      The esophagus and gastroesophageal junction were examined with white       light from a forward view  and retroflexed position. [Classification]. At       the upper extent of the gastric folds (35 cm from the incisors)       extending to the Z-line. Circumferential salmon-colored mucosa was       present from 25 to 32 cm. The maximum longitudinal extent of these       esophageal mucosal changes was 5 cm in length. Mucosa was biopsied with       a cold forceps for histology in 3 sections at intervals of 2.5 cm at 25,       27, 30 and 33 cm from the incisors. A total of 4 specimen bottles were       sent to pathology.      Multiple 5 mm sessile polyps with no bleeding and no stigmata of recent       bleeding were found in the gastric body.      The examined duodenum was normal. Impression:           - Barrett's esophagus. Biopsied.                       - Multiple gastric polyps.                       - Normal examined duodenum. Recommendation:       - Telephone endoscopist for pathology results in 1 week. Procedure Code(s):    --- Professional ---                       [phone], Esophagogastroduodenoscopy, flexible, transoral;                        with biopsy, single or multiple Diagnosis Code(s):    --- Professional ---                       K22.70, Barrett's esophagus without dysplasia                       K31.7, Polyp of stomach and duodenum CPT copyright 2019 American Medical Association. All rights reserved. The codes documented in this report are preliminary and upon coder review may  be revised to meet current compliance requirements. Robert Bellow, MD 11/18/2018 9:17:00 AM This report has been signed electronically. Number of Addenda: 0 Note Initiated On: 11/18/2018 8:27 AM Estimated Blood Loss: Estimated blood loss: none.      [facility]

## 2018-11-18 NOTE — Transfer of Care (Signed)
Immediate Anesthesia Transfer of Care Note  Patient: Kathryn Brooks  Procedure(s) Performed: COLONOSCOPY WITH PROPOFOL (N/A ) ESOPHAGOGASTRODUODENOSCOPY (EGD) WITH PROPOFOL (N/A )  Patient Location: PACU  Anesthesia Type:General  Level of Consciousness: drowsy  Airway & Oxygen Therapy: Patient Spontanous Breathing and Patient connected to nasal cannula oxygen  Post-op Assessment: Report given to RN and Post -op Vital signs reviewed and stable  Post vital signs: Reviewed and stable  Last Vitals:  Vitals Value Taken Time  BP 98/67 11/18/18 0910  Temp 36.1 C 11/18/18 0910  Pulse 70 11/18/18 0910  Resp 14 11/18/18 0910  SpO2 94 % 11/18/18 0910    Last Pain:  Vitals:   11/18/18 0910  TempSrc: Tympanic  PainSc: Asleep         Complications: No apparent anesthesia complications

## 2018-11-18 NOTE — Anesthesia Procedure Notes (Signed)
Performed by: Kurt Hoffmeier, CRNA Pre-anesthesia Checklist: Patient identified, Emergency Drugs available, Suction available, Patient being monitored and Timeout performed Patient Re-evaluated:Patient Re-evaluated prior to induction Oxygen Delivery Method: Nasal cannula Induction Type: IV induction       

## 2018-11-18 NOTE — H&P (Signed)
Kathryn Brooks [id number] Jul 18, 1935     HPI:  No change in clinical history or exam.  Follow up upper endoscopy for documented Barrett's epithelial change, colonoscopy for family history.   Medications Prior to Admission  Medication Sig Dispense Refill Last Dose  . aspirin 81 MG tablet Take 81 mg by mouth daily.   11/17/2018 at Unknown time  . cholecalciferol (VITAMIN D) 1000 units tablet Take 2,000 Units by mouth daily.    11/17/2018 at Unknown time  . donepezil (ARICEPT) 10 MG tablet Take 10 mg by mouth at bedtime.    11/17/2018 at Unknown time  . levothyroxine (SYNTHROID, LEVOTHROID) 25 MCG tablet Take 25 mcg by mouth daily before breakfast. Take with 65mcg tab  0 11/17/2018 at Unknown time  . levothyroxine (SYNTHROID, LEVOTHROID) 75 MCG tablet Take 37.5 mcg by mouth daily before breakfast. With 12.3mcg  3 11/17/2018 at Unknown time  . Memantine HCl (NAMENDA XR PO) Take 1 tablet by mouth 2 (two) times daily.    11/17/2018 at Unknown time  . omeprazole (PRILOSEC) 40 MG capsule Take 1 capsule by mouth daily as needed. Acid reflux  0 11/17/2018 at Unknown time  . vitamin B-12 (CYANOCOBALAMIN) 1000 MCG tablet Take 1,000 mcg by mouth daily.    11/17/2018 at Unknown time  . polyethylene glycol powder (GLYCOLAX/MIRALAX) 17 GM/SCOOP powder 255 grams one bottle for colonoscopy prep 255 g 0    No Known Allergies Past Medical History:  Diagnosis Date  . Barrett's esophagus [id number]   biopsies were completed at 34,32,30,and 26 cm from the incisors. gastric cardia type mucosa was identified from 30-34 cm. Squamocolumnar mucosa with reflux gastroesophagitis and Barrett's esophagus was noted at 26 cm. No atypia or dysplasia was identified. The previous biopsies of December 18, 2010 at 25,30,35 cm showed similar changes. There has been no progression of the Barrett's(long segment) and  . Barrett's esophagus determined by biopsy 2012   No atypia on March 2014 biopsy.  . Blood in stool [id number]  . Breast screening,  unspecified 2013  . Dementia ([place])   . GERD (gastroesophageal reflux disease) [id number]  . Obesity, unspecified 2013  . Special screening for malignant neoplasms, colon 2013  . Thyroid disease    Past Surgical History:  Procedure Laterality Date  . COLONOSCOPY N/A 2005,2012   heme positive stools  . ESOPHAGOGASTRODUODENOSCOPY N/A 09/28/2014   Procedure: ESOPHAGOGASTRODUODENOSCOPY (EGD);  Surgeon: Robert Bellow, MD;  Location: [facility] ENDOSCOPY;  Service: Endoscopy;  Laterality: N/A;  . ESOPHAGOGASTRODUODENOSCOPY (EGD) WITH PROPOFOL N/A 10/30/2016   Procedure: ESOPHAGOGASTRODUODENOSCOPY (EGD) WITH PROPOFOL;  Surgeon: Robert Bellow, MD;  Location: [facility] ENDOSCOPY;  Service: Endoscopy;  Laterality: N/A;  . THYROIDECTOMY Right 2015  . TUBAL LIGATION    . UPPER GASTROINTESTINAL ENDOSCOPY N/A 2012   chronic grastitis, Barrett's esophagus   Social History   Socioeconomic History  . Marital status: Married    Spouse name: Not on file  . Number of children: 3  . Years of education: Not on file  . Highest education level: Associate degree: occupational, Hotel manager, or vocational program  Occupational History  . Occupation: retired  Scientific laboratory technician  . Financial resource strain: Not hard at all  . Food insecurity    Worry: Never true    Inability: Never true  . Transportation needs    Medical: No    Non-medical: No  Tobacco Use  . Smoking status: Never Smoker  . Smokeless tobacco: Never Used  Substance and Sexual Activity  . Alcohol  use: No  . Drug use: No  . Sexual activity: Not on file  Lifestyle  . Physical activity    Days per week: 0 days    Minutes per session: 0 min  . Stress: Not at all  Relationships  . Social Herbalist on phone: Patient refused    Gets together: Patient refused    Attends religious service: Patient refused    Active member of club or organization: Patient refused    Attends meetings of clubs or organizations: Patient refused     Relationship status: Patient refused  . Intimate partner violence    Fear of current or ex partner: Patient refused    Emotionally abused: Patient refused    Physically abused: Patient refused    Forced sexual activity: Patient refused  Other Topics Concern  . Not on file  Social History Narrative  . Not on file   Social History   Social History Narrative  . Not on file     ROS: Negative.     PE: HEENT: Negative. Lungs: Clear. Cardio: RR.  Assessment/Plan:  Proceed with planned endoscopy.  Forest Gleason [facility] 11/18/2018

## 2018-11-18 NOTE — Op Note (Signed)
[facility] Gastroenterology Patient Name: Kathryn Brooks Procedure Date: 11/18/2018 8:26 AM MRN: [id number] Account #: [id number] Date of Birth: 02/24/1936 Admit Type: Outpatient Age: 83 Room: [facility] ENDO ROOM 1 Gender: Female Note Status: Finalized Procedure:            Colonoscopy Indications:          Family history of colon cancer in a first-degree                        relative before age 55 years Providers:            Robert Bellow, MD Referring MD:         Serita Grammes (Referring MD) Medicines:            Monitored Anesthesia Care Complications:        No immediate complications. Procedure:            Pre-Anesthesia Assessment:                       - Prior to the procedure, a History and Physical was                        performed, and patient medications, allergies and                        sensitivities were reviewed. The patient's tolerance of                        previous anesthesia was reviewed.                       - The risks and benefits of the procedure and the                        sedation options and risks were discussed with the                        patient. All questions were answered and informed                        consent was obtained.                       After obtaining informed consent, the colonoscope was                        passed under direct vision. Throughout the procedure,                        the patient's blood pressure, pulse, and oxygen                        saturations were monitored continuously. The                        Colonoscope was introduced through the anus and                        advanced to the the cecum, identified by appendiceal  orifice and ileocecal valve. The colonoscopy was                        performed without difficulty. The patient tolerated the                        procedure well. The quality of the bowel preparation                        was  excellent. Findings:      A diffuse area of mildly friable (with contact bleeding) mucosa was       found in the ascending colon. Biopsies were taken with a cold forceps       for histology.      A 5 mm polyp was found in the transverse colon. The polyp was sessile.       Biopsies were taken with a cold forceps for histology.      A diffuse area of mildly erythematous mucosa was found in the descending       colon. Biopsies were taken with a cold forceps for histology.      The retroflexed view of the distal rectum and anal verge was normal and       showed no anal or rectal abnormalities. Impression:           - Friable (with contact bleeding) mucosa in the                        ascending colon. Biopsied.                       - One 5 mm polyp in the transverse colon. Biopsied.                       - Erythematous mucosa in the descending colon. Biopsied.                       - The distal rectum and anal verge are normal on                        retroflexion view. Recommendation:       - Telephone endoscopist for pathology results in 1 week. Procedure Code(s):    --- Professional ---                       [phone], Colonoscopy, flexible; with biopsy, single or                        multiple Diagnosis Code(s):    --- Professional ---                       Z80.0, Family history of malignant neoplasm of                        digestive organs                       K63.5, Polyp of colon                       K63.89, Other specified diseases of intestine CPT copyright 2019 American Medical Association. All rights reserved.  The codes documented in this report are preliminary and upon coder review may  be revised to meet current compliance requirements. Robert Bellow, MD 11/18/2018 9:12:32 AM This report has been signed electronically. Number of Addenda: 0 Note Initiated On: 11/18/2018 8:26 AM Scope Withdrawal Time: 0 hours 10 minutes 19 seconds  Total Procedure Duration: 0 hours 16  minutes 26 seconds  Estimated Blood Loss: Estimated blood loss: none.      [facility]

## 2018-11-18 NOTE — Anesthesia Preprocedure Evaluation (Addendum)
Anesthesia Evaluation  Patient identified by MRN, date of birth, ID band Patient awake    Reviewed: Allergy & Precautions, H&P , NPO status , Patient's Chart, lab work & pertinent test results  Airway Mallampati: II  TM Distance: >3 FB     Dental  (+) Upper Dentures, Partial Lower   Pulmonary neg pulmonary ROS, neg shortness of breath, neg COPD, neg recent URI,           Cardiovascular (-) angina(-) Past MI, (-) Cardiac Stents and (-) CHF negative cardio ROS  (-) dysrhythmias      Neuro/Psych Dementia negative neurological ROS     GI/Hepatic Neg liver ROS, GERD (no symptoms today)  Controlled,Barrett's esophagus   Endo/Other  Hypothyroidism   Renal/GU negative Renal ROS  negative genitourinary   Musculoskeletal   Abdominal   Peds  Hematology negative hematology ROS (+)   Anesthesia Other Findings Past Medical History: [id number]: Barrett's esophagus     Comment:  biopsies were completed at 34,32,30,and 26 cm from the               incisors. gastric cardia type mucosa was identified from               30-34 cm. Squamocolumnar mucosa with reflux               gastroesophagitis and Barrett's esophagus was noted at 26              cm. No atypia or dysplasia was identified. The previous               biopsies of December 18, 2010 at 25,30,35 cm showed similar               changes. There has been no progression of the               Barrett's(long segment) and 2012: Barrett's esophagus determined by biopsy     Comment:  No atypia on March 2014 biopsy. [id number]: Blood in stool 2013: Breast screening, unspecified [id number]: GERD (gastroesophageal reflux disease) 2013: Obesity, unspecified 2013: Special screening for malignant neoplasms, colon No date: Thyroid disease  Past Surgical History: 2005,2012: COLONOSCOPY; N/A     Comment:  heme positive stools 09/28/2014: ESOPHAGOGASTRODUODENOSCOPY; N/A     Comment:   Procedure: ESOPHAGOGASTRODUODENOSCOPY (EGD);  Surgeon:               Robert Bellow, MD;  Location: [facility] ENDOSCOPY;                Service: Endoscopy;  Laterality: N/A; 10/30/2016: ESOPHAGOGASTRODUODENOSCOPY (EGD) WITH PROPOFOL; N/A     Comment:  Procedure: ESOPHAGOGASTRODUODENOSCOPY (EGD) WITH               PROPOFOL;  Surgeon: Robert Bellow, MD;  Location:               [facility] ENDOSCOPY;  Service: Endoscopy;  Laterality: N/A; 2015: THYROIDECTOMY; Right No date: TUBAL LIGATION 2012: UPPER GASTROINTESTINAL ENDOSCOPY; N/A     Comment:  chronic grastitis, Barrett's esophagus     Reproductive/Obstetrics negative OB ROS                            Anesthesia Physical Anesthesia Plan  ASA: II  Anesthesia Plan: General   Post-op Pain Management:    Induction:   PONV Risk Score and Plan: Propofol infusion and TIVA  Airway Management Planned: Natural Airway and Nasal  Cannula  Additional Equipment:   Intra-op Plan:   Post-operative Plan:   Informed Consent: I have reviewed the patients History and Physical, chart, labs and discussed the procedure including the risks, benefits and alternatives for the proposed anesthesia with the patient or authorized representative who has indicated his/her understanding and acceptance.     Dental Advisory Given  Plan Discussed with: Anesthesiologist and CRNA  Anesthesia Plan Comments:        Anesthesia Quick Evaluation

## 2018-11-19 NOTE — Anesthesia Postprocedure Evaluation (Signed)
Anesthesia Post Note  Patient: Kathryn Brooks  Procedure(s) Performed: COLONOSCOPY WITH PROPOFOL (N/A ) ESOPHAGOGASTRODUODENOSCOPY (EGD) WITH PROPOFOL (N/A )  Patient location during evaluation: PACU Anesthesia Type: General Level of consciousness: awake and alert Pain management: pain level controlled Vital Signs Assessment: post-procedure vital signs reviewed and stable Respiratory status: spontaneous breathing, nonlabored ventilation and respiratory function stable Cardiovascular status: blood pressure returned to baseline and stable Postop Assessment: no apparent nausea or vomiting Anesthetic complications: no     Last Vitals:  Vitals:   11/18/18 0920 11/18/18 0930  BP: 131/71 117/73  Pulse: 71 61  Resp: 19 11  Temp:    SpO2: 98% 98%    Last Pain:  Vitals:   11/19/18 0710  TempSrc:   PainSc: 0-No pain                 Durenda Hurt

## 2018-11-20 LAB — SURGICAL PATHOLOGY

## 2018-11-24 ENCOUNTER — Telehealth: Payer: Self-pay | Admitting: *Deleted

## 2018-11-24 NOTE — Telephone Encounter (Signed)
-----   Message from Robert Bellow, MD sent at 11/24/2018 11:28 AM EDT ----- Please notify the biopsies show no change over past exams.  F/U in two years unless she is having problems.  ----- Message ----- From: Interface, Lab In Three Zero One Sent: 11/20/2018   4:22 PM EDT To: Robert Bellow, MD

## 2018-11-24 NOTE — Telephone Encounter (Signed)
Patient contacted and notified per previous message. She verbalizes understanding.  The patient was placed in recalls for 11-2020.

## 2019-03-10 NOTE — Progress Notes (Signed)
Subjective:   Kathryn Brooks is a 83 y.o. female who presents for Medicare Annual (Subsequent) preventive examination.    This visit is being conducted through telemedicine due to the COVID-19 pandemic. This patient has given me verbal consent via doximity to conduct this visit, patient states they are participating from their home address. Some vital signs may be absent or patient reported.    Patient identification: identified by name, DOB, and current address  Review of Systems:  N/A  Cardiac Risk Factors include: advanced age (>68men, >45 women)     Objective:     Vitals: There were no vitals taken for this visit.  There is no height or weight on file to calculate BMI. Unable to obtain vitals due to visit being conducted via telephonically.   Advanced Directives 03/11/2019 11/18/2018 03/09/2018 03/06/2017 01/08/2017 10/30/2016 06/05/2016  Does Patient Have a Medical Advance Directive? No No No No No No No  Would patient like information on creating a medical advance directive? No - Patient declined - No - Patient declined No - Patient declined - - -    Tobacco Social History   Tobacco Use  Smoking Status Never Smoker  Smokeless Tobacco Never Used     Counseling given: Not Answered   Clinical Intake:  Pre-visit preparation completed: Yes  Pain : No/denies pain Pain Score: 0-No pain     Nutritional Risks: None Diabetes: No  How often do you need to have someone help you when you read instructions, pamphlets, or other written materials from your doctor or pharmacy?: 1 - Never  Interpreter Needed?: No  Information entered by :: [facility], LPN  Past Medical History:  Diagnosis Date  . Barrett's esophagus [id number]   biopsies were completed at 34,32,30,and 26 cm from the incisors. gastric cardia type mucosa was identified from 30-34 cm. Squamocolumnar mucosa with reflux gastroesophagitis and Barrett's esophagus was noted at 26 cm. No atypia or dysplasia was  identified. The previous biopsies of December 18, 2010 at 25,30,35 cm showed similar changes. There has been no progression of the Barrett's(long segment) and  . Barrett's esophagus determined by biopsy 2012   No atypia on March 2014 biopsy.  . Blood in stool [id number]  . Breast screening, unspecified 2013  . Dementia ([place])   . GERD (gastroesophageal reflux disease) [id number]  . Obesity, unspecified 2013  . Special screening for malignant neoplasms, colon 2013  . Thyroid disease    Past Surgical History:  Procedure Laterality Date  . COLONOSCOPY N/A 2005,2012   heme positive stools  . COLONOSCOPY WITH PROPOFOL N/A 11/18/2018   Procedure: COLONOSCOPY WITH PROPOFOL;  Surgeon: Robert Bellow, MD;  Location: [facility] ENDOSCOPY;  Service: Endoscopy;  Laterality: N/A;  . ESOPHAGOGASTRODUODENOSCOPY N/A 09/28/2014   Procedure: ESOPHAGOGASTRODUODENOSCOPY (EGD);  Surgeon: Robert Bellow, MD;  [facility] ENDOSCOPY;  Service: Endoscopy;  Laterality: N/A;  . ESOPHAGOGASTRODUODENOSCOPY (EGD) WITH PROPOFOL N/A 10/30/2016   Procedure: ESOPHAGOGASTRODUODENOSCOPY (EGD) WITH PROPOFOL;  Surgeon: Robert Bellow, MD;  Location: [facility] ENDOSCOPY;  Service: Endoscopy;  Laterality: N/A;  . ESOPHAGOGASTRODUODENOSCOPY (EGD) WITH PROPOFOL N/A 11/18/2018   Procedure: ESOPHAGOGASTRODUODENOSCOPY (EGD) WITH PROPOFOL;  Surgeon: Robert Bellow, MD;  Location: [facility] ENDOSCOPY;  Service: Endoscopy;  Laterality: N/A;  . THYROIDECTOMY Right 2015  . TUBAL LIGATION    . UPPER GASTROINTESTINAL ENDOSCOPY N/A 2012   chronic grastitis, Barrett's esophagus   Family History  Problem Relation Age of Onset  . Colon cancer Mother        07/19/2004  .  Cancer Mother    Social History   Socioeconomic History  . Marital status: Married    Spouse name: Not on file  . Number of children: 3  . Years of education: Not on file  . Highest education level: Associate degree: occupational, Hotel manager, or vocational program  Occupational  History  . Occupation: retired  Scientific laboratory technician  . Financial resource strain: Not hard at all  . Food insecurity    Worry: Never true    Inability: Never true  . Transportation needs    Medical: No    Non-medical: No  Tobacco Use  . Smoking status: Never Smoker  . Smokeless tobacco: Never Used  Substance and Sexual Activity  . Alcohol use: No  . Drug use: No  . Sexual activity: Not on file  Lifestyle  . Physical activity    Days per week: 0 days    Minutes per session: 0 min  . Stress: Not at all  Relationships  . Social Herbalist on phone: Patient refused    Gets together: Patient refused    Attends religious service: Patient refused    Active member of club or organization: Patient refused    Attends meetings of clubs or organizations: Patient refused    Relationship status: Patient refused  Other Topics Concern  . Not on file  Social History Narrative  . Not on file    Outpatient Encounter Medications as of 03/11/2019  Medication Sig  . aspirin 81 MG tablet Take 81 mg by mouth daily.  . cholecalciferol (VITAMIN D) 1000 units tablet Take 2,000 Units by mouth daily.   . Cyanocobalamin (VITAMIN B-12 IJ) Inject as directed every 30 (thirty) days.  Marland Kitchen levothyroxine (SYNTHROID, LEVOTHROID) 25 MCG tablet Take 12.5 mcg by mouth daily before breakfast. Take with 28mcg tab that is halved (37.5mg )  . levothyroxine (SYNTHROID, LEVOTHROID) 75 MCG tablet Take 37.5 mcg by mouth daily before breakfast. With 12.62mcg  . Memantine HCl (NAMENDA XR PO) Take 1 tablet by mouth 2 (two) times daily.   Marland Kitchen omeprazole (PRILOSEC) 40 MG capsule Take 1 capsule by mouth daily as needed. Acid reflux  . donepezil (ARICEPT) 10 MG tablet Take 10 mg by mouth at bedtime.   . polyethylene glycol powder (GLYCOLAX/MIRALAX) 17 GM/SCOOP powder 255 grams one bottle for colonoscopy prep (Patient not taking: Reported on 03/11/2019)  . vitamin B-12 (CYANOCOBALAMIN) 1000 MCG tablet Take 1,000 mcg by mouth  daily.    No facility-administered encounter medications on file as of 03/11/2019.     Activities of Daily Living In your present state of health, do you have any difficulty performing the following activities: 03/11/2019  Hearing? N  Vision? N  Difficulty concentrating or making decisions? Y  Comment Currently on Namenda.  Walking or climbing stairs? N  Comment Avoids steps.  Dressing or bathing? N  Doing errands, shopping? N  Preparing Food and eating ? N  Using the Toilet? N  In the past six months, have you accidently leaked urine? N  Do you have problems with loss of bowel control? N  Managing your Medications? N  Managing your Finances? N  Housekeeping or managing your Housekeeping? N  Some recent data might be hidden    Patient Care Team: Mar Daring, PA-C as PCP - General (Family Medicine) Bary Castilla, Forest Gleason, MD (General Surgery) Carloyn Manner, MD as Referring Physician (Otolaryngology) Anell Barr, OD as Consulting Physician (Optometry)    Assessment:   This is a  routine wellness examination for Juliette.  Exercise Activities and Dietary recommendations Current Exercise Habits: The patient does not participate in regular exercise at present, Exercise limited by: None identified  Goals    . Increase water intake     Recommend increasing water intake to 4-6 glasses a day.     Marland Kitchen LIFESTYLE - DECREASE FALLS RISK     Recommend to remove any items from the home that may cause slips or trips.           Fall Risk: Fall Risk  03/11/2019 03/11/2019 11/10/2018 03/09/2018 03/06/2017  Falls in the past year? 0 0 0 Yes No  Number falls in past yr: 0 0 - 1 -  Injury with Fall? 0 0 - No -  Follow up - - - Falls prevention discussed -    FALL RISK PREVENTION PERTAINING TO THE HOME:  Any stairs in or around the home? No  If so, are there any without handrails? N/A  Home free of loose throw rugs in walkways, pet beds, electrical cords, etc? Yes   Adequate lighting in your home to reduce risk of falls? Yes   ASSISTIVE DEVICES UTILIZED TO PREVENT FALLS:  Life alert? No  Use of a cane, walker or w/c? No  Grab bars in the bathroom? No  Shower chair or bench in shower? No  Elevated toilet seat or a handicapped toilet? No    TIMED UP AND GO:  Was the test performed? No .    Depression Screen PHQ 2/9 Scores 03/11/2019 03/09/2018 03/06/2017 11/29/2016  PHQ - 2 Score 0 0 0 0     Cognitive Function: Declined today.      6CIT Screen 04/01/2018  What Year? 4 points  What month? 3 points  What time? 0 points  Count back from 20 0 points  Months in reverse 2 points  Repeat phrase 2 points  Total Score 11    Immunization History  Administered Date(s) Administered  . Influenza, High Dose Seasonal PF 02/09/2015, 03/09/2018  . Pneumococcal Conjugate-13 08/01/2014  . Pneumococcal Polysaccharide-23 11/30/2010  . Td 06/21/2004    Qualifies for Shingles Vaccine? Yes . Due for Shingrix. Education has been provided regarding the importance of this vaccine. Pt has been advised to call insurance company to determine out of pocket expense. Advised may also receive vaccine at local pharmacy or Health Dept. Verbalized acceptance and understanding.  Tdap: Although this vaccine is not a covered service during a Wellness Exam, does the patient still wish to receive this vaccine today?  No .   Flu Vaccine: Up to date  Pneumococcal Vaccine: Completed series  Screening Tests Health Maintenance  Topic Date Due  . TETANUS/TDAP  04/02/2019 (Originally 06/21/2014)  . INFLUENZA VACCINE  Completed  . DEXA SCAN  Completed  . PNA vac Low Risk Adult  Completed    Cancer Screenings:  Colorectal Screening: No longer required.   Mammogram: No longer required.   Bone Density: Completed 08/11/14. Results reflect NORMAL. No repeat needed unless advised by a physician.   Lung Cancer Screening: (Low Dose CT Chest recommended if Age 79-80 years,  30 pack-year currently smoking OR have quit w/in 15years.) does not qualify.   Additional Screening:  Vision Screening: Recommended annual ophthalmology exams for early detection of glaucoma and other disorders of the eye.  Dental Screening: Recommended annual dental exams for proper oral hygiene  Community Resource Referral:  CRR required this visit?  No       Plan:  I have personally reviewed and addressed the Medicare Annual Wellness questionnaire and have noted the following in the patient's chart:  A. Medical and social history B. Use of alcohol, tobacco or illicit drugs  C. Current medications and supplements D. Functional ability and status E.  Nutritional status F.  Physical activity G. Advance directives H. List of other physicians I.  Hospitalizations, surgeries, and ER visits in previous 12 months J.  [place] such as hearing and vision if needed, cognitive and depression L. Referrals and appointments   In addition, I have reviewed and discussed with patient certain preventive protocols, quality metrics, and best practice recommendations. A written personalized care plan for preventive services as well as general preventive health recommendations were provided to patient. Nurse Health Advisor  Signed,    Danica Camarena [place], [place]  075-GRM Nurse Health Advisor   Nurse Notes: None.

## 2019-03-11 ENCOUNTER — Other Ambulatory Visit: Payer: Self-pay

## 2019-03-11 ENCOUNTER — Ambulatory Visit (INDEPENDENT_AMBULATORY_CARE_PROVIDER_SITE_OTHER): Payer: Medicare Other

## 2019-03-11 DIAGNOSIS — Z0000 Encounter for general adult medical examination without abnormal findings: Secondary | ICD-10-CM

## 2019-03-11 NOTE — Patient Instructions (Addendum)
Kathryn Brooks , Thank you for taking time to come for your Medicare Wellness Visit. I appreciate your ongoing commitment to your health goals. Please review the following plan we discussed and let me know if I can assist you in the future.   Screening recommendations/referrals: Colonoscopy: No longer required.  Mammogram: No longer required.  Bone Density: Up to date, previous DEXA was normal. No repeat needed unless advised by a physician. Recommended yearly ophthalmology/optometry visit for glaucoma screening and checkup Recommended yearly dental visit for hygiene and checkup  Vaccinations: Influenza vaccine: Up to date Pneumococcal vaccine: Completed series Tdap vaccine: Pt declines today.  Shingles vaccine: Pt declines today.     Advanced directives: Advance directive discussed with you today. Even though you declined this today please call our office should you change your mind and we can give you the proper paperwork for you to fill out.  Conditions/risks identified: Continue to increase water intake to 6-8 8 oz glasses a day.   Next appointment: 04/07/19 @ 9:00 AM with Dr Caryn Section. Declined scheduling an AWV for 2021 at this time.    Preventive Care 81 Years and Older, Female Preventive care refers to lifestyle choices and visits with your health care provider that can promote health and wellness. What does preventive care include?  A yearly physical exam. This is also called an annual well check.  Dental exams once or twice a year.  Routine eye exams. Ask your health care provider how often you should have your eyes checked.  Personal lifestyle choices, including:  Daily care of your teeth and gums.  Regular physical activity.  Eating a healthy diet.  Avoiding tobacco and drug use.  Limiting alcohol use.  Practicing safe sex.  Taking low-dose aspirin every day.  Taking vitamin and mineral supplements as recommended by your health care provider. What happens during  an annual well check? The services and screenings done by your health care provider during your annual well check will depend on your age, overall health, lifestyle risk factors, and family history of disease. Counseling  Your health care provider may ask you questions about your:  Alcohol use.  Tobacco use.  Drug use.  Emotional well-being.  Home and relationship well-being.  Sexual activity.  Eating habits.  History of falls.  Memory and ability to understand (cognition).  Work and work Statistician.  Reproductive health. Screening  You may have the following tests or measurements:  Height, weight, and BMI.  Blood pressure.  Lipid and cholesterol levels. These may be checked every 5 years, or more frequently if you are over 21 years old.  Skin check.  Lung cancer screening. You may have this screening every year starting at age 60 if you have a 30-pack-year history of smoking and currently smoke or have quit within the past 15 years.  Fecal occult blood test (FOBT) of the stool. You may have this test every year starting at age 71.  Flexible sigmoidoscopy or colonoscopy. You may have a sigmoidoscopy every 5 years or a colonoscopy every 10 years starting at age 75.  Hepatitis C blood test.  Hepatitis B blood test.  Sexually transmitted disease (STD) testing.  Diabetes screening. This is done by checking your blood sugar (glucose) after you have not eaten for a while (fasting). You may have this done every 1-3 years.  Bone density scan. This is done to screen for osteoporosis. You may have this done starting at age 66.  Mammogram. This may be done every 1-2  years. Talk to your health care provider about how often you should have regular mammograms. Talk with your health care provider about your test results, treatment options, and if necessary, the need for more tests. Vaccines  Your health care provider may recommend certain vaccines, such as:  Influenza  vaccine. This is recommended every year.  Tetanus, diphtheria, and acellular pertussis (Tdap, Td) vaccine. You may need a Td booster every 10 years.  Zoster vaccine. You may need this after age 77.  Pneumococcal 13-valent conjugate (PCV13) vaccine. One dose is recommended after age 10.  Pneumococcal polysaccharide (PPSV23) vaccine. One dose is recommended after age 39. Talk to your health care provider about which screenings and vaccines you need and how often you need them. This information is not intended to replace advice given to you by your health care provider. Make sure you discuss any questions you have with your health care provider. Document Released: 05/26/2015 Document Revised: 01/17/2016 Document Reviewed: 02/28/2015 Elsevier Interactive Patient Education  2017 [place] Prevention in the Home Falls can cause injuries. They can happen to people of all ages. There are many things you can do to make your home safe and to help prevent falls. What can I do on the outside of my home?  Regularly fix the edges of walkways and driveways and fix any cracks.  Remove anything that might make you trip as you walk through a door, such as a raised step or threshold.  Trim any bushes or trees on the path to your home.  Use bright outdoor lighting.  Clear any walking paths of anything that might make someone trip, such as rocks or tools.  Regularly check to see if handrails are loose or broken. Make sure that both sides of any steps have handrails.  Any raised decks and porches should have guardrails on the edges.  Have any leaves, snow, or ice cleared regularly.  Use sand or salt on walking paths during winter.  Clean up any spills in your garage right away. This includes oil or grease spills. What can I do in the bathroom?  Use night lights.  Install grab bars by the toilet and in the tub and shower. Do not use towel bars as grab bars.  Use non-skid mats or decals  in the tub or shower.  If you need to sit down in the shower, use a plastic, non-slip stool.  Keep the floor dry. Clean up any water that spills on the floor as soon as it happens.  Remove soap buildup in the tub or shower regularly.  Attach bath mats securely with double-sided non-slip rug tape.  Do not have throw rugs and other things on the floor that can make you trip. What can I do in the bedroom?  Use night lights.  Make sure that you have a light by your bed that is easy to reach.  Do not use any sheets or blankets that are too big for your bed. They should not hang down onto the floor.  Have a firm chair that has side arms. You can use this for support while you get dressed.  Do not have throw rugs and other things on the floor that can make you trip. What can I do in the kitchen?  Clean up any spills right away.  Avoid walking on wet floors.  Keep items that you use a lot in easy-to-reach places.  If you need to reach something above you, use a strong step  stool that has a grab bar.  Keep electrical cords out of the way.  Do not use floor polish or wax that makes floors slippery. If you must use wax, use non-skid floor wax.  Do not have throw rugs and other things on the floor that can make you trip. What can I do with my stairs?  Do not leave any items on the stairs.  Make sure that there are handrails on both sides of the stairs and use them. Fix handrails that are broken or loose. Make sure that handrails are as long as the stairways.  Check any carpeting to make sure that it is firmly attached to the stairs. Fix any carpet that is loose or worn.  Avoid having throw rugs at the top or bottom of the stairs. If you do have throw rugs, attach them to the floor with carpet tape.  Make sure that you have a light switch at the top of the stairs and the bottom of the stairs. If you do not have them, ask someone to add them for you. What else can I do to help  prevent falls?  Wear shoes that:  Do not have high heels.  Have rubber bottoms.  Are comfortable and fit you well.  Are closed at the toe. Do not wear sandals.  If you use a stepladder:  Make sure that it is fully opened. Do not climb a closed stepladder.  Make sure that both sides of the stepladder are locked into place.  Ask someone to hold it for you, if possible.  Clearly mark and make sure that you can see:  Any grab bars or handrails.  First and last steps.  Where the edge of each step is.  Use tools that help you move around (mobility aids) if they are needed. These include:  Canes.  Walkers.  Scooters.  Crutches.  Turn on the lights when you go into a dark area. Replace any light bulbs as soon as they burn out.  Set up your furniture so you have a clear path. Avoid moving your furniture around.  If any of your floors are uneven, fix them.  If there are any pets around you, be aware of where they are.  Review your medicines with your doctor. Some medicines can make you feel dizzy. This can increase your chance of falling. Ask your doctor what other things that you can do to help prevent falls. This information is not intended to replace advice given to you by your health care provider. Make sure you discuss any questions you have with your health care provider. Document Released: 02/23/2009 Document Revised: 10/05/2015 Document Reviewed: 06/03/2014 Elsevier Interactive Patient Education  2017 [employer].

## 2019-04-05 NOTE — Progress Notes (Signed)
Patient: Kathryn Brooks, Female    DOB: 01/29/1936, 83 y.o.   MRN: [id number] Visit Date: 04/07/2019  Today's Provider: Mar Daring, PA-C   Chief Complaint  Patient presents with  . Annual Exam   Subjective:     Patient had her AWV with [facility] on 03/11/2019   Complete Physical Kathryn Brooks is a 83 y.o. female. She feels well. She reports exercising none. She reports she is sleeping well. ----------------------------------------------------------- Last Pap : 11/30/2010 Mammogram:  06/03/2012 Bi-rads Neg  Review of Systems  Constitutional: Negative.   HENT: Negative.   Eyes: Negative.   Respiratory: Negative.   Cardiovascular: Negative.   Gastrointestinal: Negative.   Endocrine: Negative.   Genitourinary: Negative.   Musculoskeletal: Negative.   Skin: Negative.   Allergic/Immunologic: Negative.   Neurological: Negative.   Hematological: Negative.   Psychiatric/Behavioral: Negative.     Social History   Socioeconomic History  . Marital status: Married    Spouse name: Not on file  . Number of children: 3  . Years of education: Not on file  . Highest education level: Associate degree: occupational, Hotel manager, or vocational program  Occupational History  . Occupation: retired  Scientific laboratory technician  . Financial resource strain: Not hard at all  . Food insecurity    Worry: Never true    Inability: Never true  . Transportation needs    Medical: No    Non-medical: No  Tobacco Use  . Smoking status: Never Smoker  . Smokeless tobacco: Never Used  Substance and Sexual Activity  . Alcohol use: No  . Drug use: No  . Sexual activity: Not on file  Lifestyle  . Physical activity    Days per week: 0 days    Minutes per session: 0 min  . Stress: Not at all  Relationships  . Social Herbalist on phone: Patient refused    Gets together: Patient refused    Attends religious service: Patient refused    Active member of club or organization: Patient  refused    Attends meetings of clubs or organizations: Patient refused    Relationship status: Patient refused  . Intimate partner violence    Fear of current or ex partner: Patient refused    Emotionally abused: Patient refused    Physically abused: Patient refused    Forced sexual activity: Patient refused  Other Topics Concern  . Not on file  Social History Narrative  . Not on file    Past Medical History:  Diagnosis Date  . Barrett's esophagus [id number]   biopsies were completed at 34,32,30,and 26 cm from the incisors. gastric cardia type mucosa was identified from 30-34 cm. Squamocolumnar mucosa with reflux gastroesophagitis and Barrett's esophagus was noted at 26 cm. No atypia or dysplasia was identified. The previous biopsies of December 18, 2010 at 25,30,35 cm showed similar changes. There has been no progression of the Barrett's(long segment) and  . Barrett's esophagus determined by biopsy 2012   No atypia on March 2014 biopsy.  . Blood in stool [id number]  . Breast screening, unspecified 2013  . Dementia ([place])   . GERD (gastroesophageal reflux disease) [id number]  . Obesity, unspecified 2013  . Special screening for malignant neoplasms, colon 2013  . Thyroid disease      Patient Active Problem List   Diagnosis Date Noted  . Vitamin B12 deficiency 11/07/2017  . Late onset Alzheimer's disease without behavioral disturbance ([place]) 07/21/2015  . Alzheimer's  dementia ([place]) 05/10/2015  . Allergic rhinitis 05/05/2015  . Other obesity 05/05/2015  . Fatigue 05/05/2015  . Hypertriglyceridemia 05/05/2015  . Breath shortness 05/05/2015  . Pain in shoulder 05/05/2015  . Skin lesion 05/05/2015  . Hypothyroid 05/05/2015  . Vitamin D deficiency 05/05/2015  . Gastro-esophageal reflux disease without esophagitis 05/05/2015  . Barrett's esophagus     Past Surgical History:  Procedure Laterality Date  . COLONOSCOPY N/A 2005,2012   heme positive stools  . COLONOSCOPY WITH PROPOFOL N/A  11/18/2018   Procedure: COLONOSCOPY WITH PROPOFOL;  Surgeon: Robert Bellow, MD;  Location: [facility] ENDOSCOPY;  Service: Endoscopy;  Laterality: N/A;  . ESOPHAGOGASTRODUODENOSCOPY N/A 09/28/2014   Procedure: ESOPHAGOGASTRODUODENOSCOPY (EGD);  Surgeon: Robert Bellow, MD;  Location: [facility] ENDOSCOPY;  Service: Endoscopy;  Laterality: N/A;  . ESOPHAGOGASTRODUODENOSCOPY (EGD) WITH PROPOFOL N/A 10/30/2016   Procedure: ESOPHAGOGASTRODUODENOSCOPY (EGD) WITH PROPOFOL;  Surgeon: Robert Bellow, MD;  Location: [facility] ENDOSCOPY;  Service: Endoscopy;  Laterality: N/A;  . ESOPHAGOGASTRODUODENOSCOPY (EGD) WITH PROPOFOL N/A 11/18/2018   Procedure: ESOPHAGOGASTRODUODENOSCOPY (EGD) WITH PROPOFOL;  Surgeon: Robert Bellow, MD;  Location: [facility] ENDOSCOPY;  Service: Endoscopy;  Laterality: N/A;  . THYROIDECTOMY Right 2015  . TUBAL LIGATION    . UPPER GASTROINTESTINAL ENDOSCOPY N/A 2012   chronic grastitis, Barrett's esophagus    Her family history includes Cancer in her mother; Colon cancer in her mother.   Current Outpatient Medications:  .  aspirin 81 MG tablet, Take 81 mg by mouth daily., Disp: , Rfl:  .  Memantine HCl (NAMENDA XR PO), Take 1 tablet by mouth 2 (two) times daily. , Disp: , Rfl:  .  omeprazole (PRILOSEC) 40 MG capsule, Take 1 capsule by mouth daily as needed. Acid reflux, Disp: , Rfl: 0 .  vitamin B-12 (CYANOCOBALAMIN) 1000 MCG tablet, Take 1,000 mcg by mouth daily. , Disp: , Rfl:  .  cholecalciferol (VITAMIN D) 1000 units tablet, Take 2,000 Units by mouth daily. , Disp: , Rfl:  .  donepezil (ARICEPT) 10 MG tablet, Take 10 mg by mouth at bedtime. , Disp: , Rfl:  .  levothyroxine (SYNTHROID, LEVOTHROID) 25 MCG tablet, Take 25 mcg by mouth daily before breakfast. , Disp: , Rfl: 0  Patient Care Team: Mar Daring, PA-C as PCP - General (Family Medicine) Bary Castilla, Forest Gleason, MD (General Surgery) Carloyn Manner, MD as Referring Physician (Otolaryngology) Anell Barr, OD as  Consulting Physician (Optometry)     Objective:    Vitals: BP (!) 160/87 (BP Location: Right Arm, Patient Position: Sitting, Cuff Size: Large)   Pulse 65   Temp (!) 97.1 F (36.2 C) (Temporal)   Resp 16   Ht 6' (1.829 m)   Wt 218 lb (98.9 kg)   BMI 29.57 kg/m   Physical Exam Vitals signs reviewed.  Constitutional:      General: She is not in acute distress.    Appearance: Normal appearance. She is well-developed. She is not ill-appearing or diaphoretic.  HENT:     Head: Normocephalic and atraumatic.     Right Ear: Tympanic membrane, ear canal and external ear normal.     Left Ear: Tympanic membrane, ear canal and external ear normal.     Nose: Nose normal.  Eyes:     General: No scleral icterus.       Right eye: No discharge.        Left eye: No discharge.     Extraocular Movements: Extraocular movements intact.     Conjunctiva/sclera: Conjunctivae  normal.     Pupils: Pupils are equal, round, and reactive to light.  Neck:     Musculoskeletal: Normal range of motion and neck supple.     Thyroid: No thyromegaly.     Vascular: No carotid bruit or JVD.     Trachea: No tracheal deviation.  Cardiovascular:     Rate and Rhythm: Normal rate and regular rhythm.     Pulses: Normal pulses.     Heart sounds: Normal heart sounds. No murmur. No friction rub. No gallop.   Pulmonary:     Effort: Pulmonary effort is normal. No respiratory distress.     Breath sounds: Normal breath sounds. No wheezing or rales.  Chest:     Chest wall: No tenderness.  Abdominal:     General: Abdomen is flat. Bowel sounds are normal. There is no distension.     Palpations: Abdomen is soft. There is no mass.     Tenderness: There is no abdominal tenderness. There is no guarding or rebound.  Musculoskeletal: Normal range of motion.        General: No tenderness.     Right lower leg: No edema.     Left lower leg: No edema.  Lymphadenopathy:     Cervical: No cervical adenopathy.  Skin:    General:  Skin is warm and dry.     Capillary Refill: Capillary refill takes less than 2 seconds.     Findings: No rash.  Neurological:     General: No focal deficit present.     Mental Status: She is alert. Mental status is at baseline.  Psychiatric:        Mood and Affect: Mood normal.        Behavior: Behavior normal.        Thought Content: Thought content normal.        Judgment: Judgment normal.     Activities of Daily Living In your present state of health, do you have any difficulty performing the following activities: 03/11/2019  Hearing? N  Vision? N  Difficulty concentrating or making decisions? Y  Comment Currently on Namenda.  Walking or climbing stairs? N  Comment Avoids steps.  Dressing or bathing? N  Doing errands, shopping? N  Preparing Food and eating ? N  Using the Toilet? N  In the past six months, have you accidently leaked urine? N  Do you have problems with loss of bowel control? N  Managing your Medications? N  Managing your Finances? N  Housekeeping or managing your Housekeeping? N  Some recent data might be hidden    Fall Risk Assessment Fall Risk  03/11/2019 03/11/2019 11/10/2018 03/09/2018 03/06/2017  Falls in the past year? 0 0 0 Yes No  Number falls in past yr: 0 0 - 1 -  Injury with Fall? 0 0 - No -  Follow up - - - Falls prevention discussed -     Depression Screen PHQ 2/9 Scores 03/11/2019 03/09/2018 03/06/2017 11/29/2016  PHQ - 2 Score 0 0 0 0   Cognitive Function: Declined 03/11/2019 6CIT Screen 04/01/2018  What Year? 4 points  What month? 3 points  What time? 0 points  Count back from 20 0 points  Months in reverse 2 points  Repeat phrase 2 points  Total Score 11   MMSE - Mini Mental State Exam 04/07/2019  Orientation to time 2  Orientation to Place 2  Registration 3  Attention/ Calculation 0  Recall 0  Language- name 2 objects 2  Language- repeat 1  Language- follow 3 step command 2  Language- read & follow direction 1  Write  a sentence 1  Copy design 1  Total score 15       Assessment & Plan:    Annual Physical Reviewed patient's Family Medical History Reviewed and updated list of patient's medical providers Assessment of cognitive impairment was done Assessed patient's functional ability Established a written schedule for health screening Jeannette Completed and Reviewed  Exercise Activities and Dietary recommendations Goals    . Increase water intake     Recommend increasing water intake to 4-6 glasses a day.     Marland Kitchen LIFESTYLE - DECREASE FALLS RISK     Recommend to remove any items from the home that may cause slips or trips.           Immunization History  Administered Date(s) Administered  . Influenza, High Dose Seasonal PF 02/09/2015, 03/09/2018  . Pneumococcal Conjugate-13 08/01/2014  . Pneumococcal Polysaccharide-23 11/30/2010  . Td 06/21/2004    Health Maintenance  Topic Date Due  . TETANUS/TDAP  06/21/2014  . INFLUENZA VACCINE  Completed  . DEXA SCAN  Completed  . PNA vac Low Risk Adult  Completed     Discussed health benefits of physical activity, and encouraged her to engage in regular exercise appropriate for her age and condition.    1. Annual physical exam Normal physical exam today. Will check labs as below and f/u pending lab results. If labs are stable and WNL she will not need to have these rechecked for one year at her next annual physical exam. She is to call the office in the meantime if she has any acute issue, questions or concerns.  2. Barrett's esophagus without dysplasia Had most recent EGD in July 2020. No dysplasia or atypia noted in biopsies. Repeat in 2 years. Followed by Dr. Bary Castilla.   3. Postoperative hypothyroidism Stable. Continue levothyroxine 97mcg.   4. Late onset Alzheimer's disease without behavioral disturbance (H[facility]) MMSE 15 today. Followed by Neurology, Dr. Manuella Ghazi. Continue Namenda XR BID and Donepezil 10mg  nightly.    5. Hypertriglyceridemia Diet controlled. Will check labs as below and f/u pending results. - Lipid Profile  6. Vitamin D deficiency H/O this. Postmenopausal. Taking OTC daily supplement of 2000 IU daily. Will check labs as below and f/u pending results. - Vitamin D (25 hydroxy)  7. Vitamin B12 deficiency H/O this. Found in October 2020. Followed by Neurology. Will recheck labs as below.  - B12  8. Need for influenza vaccination Flu vaccine given today without complication. Patient sat upright for 15 minutes to check for adverse reaction before being released. - Flu Vaccine QUAD High Dose(Fluad)  ------------------------------------------------------------------------------------------------------------    Mar Daring, PA-C  [facility]

## 2019-04-07 ENCOUNTER — Other Ambulatory Visit: Payer: Self-pay

## 2019-04-07 ENCOUNTER — Encounter: Payer: Self-pay | Admitting: Physician Assistant

## 2019-04-07 ENCOUNTER — Ambulatory Visit (INDEPENDENT_AMBULATORY_CARE_PROVIDER_SITE_OTHER): Payer: Medicare Other | Admitting: Physician Assistant

## 2019-04-07 VITALS — BP 160/87 | HR 65 | Temp 97.1°F | Resp 16 | Ht 72.0 in | Wt 218.0 lb

## 2019-04-07 DIAGNOSIS — K2270 Barrett's esophagus without dysplasia: Secondary | ICD-10-CM | POA: Diagnosis not present

## 2019-04-07 DIAGNOSIS — F0280 Dementia in other diseases classified elsewhere without behavioral disturbance: Secondary | ICD-10-CM

## 2019-04-07 DIAGNOSIS — G301 Alzheimer's disease with late onset: Secondary | ICD-10-CM

## 2019-04-07 DIAGNOSIS — E890 Postprocedural hypothyroidism: Secondary | ICD-10-CM

## 2019-04-07 DIAGNOSIS — Z0000 Encounter for general adult medical examination without abnormal findings: Secondary | ICD-10-CM | POA: Diagnosis not present

## 2019-04-07 DIAGNOSIS — E559 Vitamin D deficiency, unspecified: Secondary | ICD-10-CM

## 2019-04-07 DIAGNOSIS — Z23 Encounter for immunization: Secondary | ICD-10-CM | POA: Diagnosis not present

## 2019-04-07 DIAGNOSIS — E781 Pure hyperglyceridemia: Secondary | ICD-10-CM

## 2019-04-07 DIAGNOSIS — E538 Deficiency of other specified B group vitamins: Secondary | ICD-10-CM

## 2019-04-07 NOTE — Patient Instructions (Signed)
Health Maintenance After Age 81 After age 81, you are at a higher risk for certain long-term diseases and infections as well as injuries from falls. Falls are a major cause of broken bones and head injuries in people who are older than age 81. Getting regular preventive care can help to keep you healthy and well. Preventive care includes getting regular testing and making lifestyle changes as recommended by your health care provider. Talk with your health care provider about:  Which screenings and tests you should have. A screening is a test that checks for a disease when you have no symptoms.  A diet and exercise plan that is right for you. What should I know about screenings and tests to prevent falls? Screening and testing are the best ways to find a health problem early. Early diagnosis and treatment give you the best chance of managing medical conditions that are common after age 81. Certain conditions and lifestyle choices may make you more likely to have a fall. Your health care provider may recommend:  Regular vision checks. Poor vision and conditions such as cataracts can make you more likely to have a fall. If you wear glasses, make sure to get your prescription updated if your vision changes.  Medicine review. Work with your health care provider to regularly review all of the medicines you are taking, including over-the-counter medicines. Ask your health care provider about any side effects that may make you more likely to have a fall. Tell your health care provider if any medicines that you take make you feel dizzy or sleepy.  Osteoporosis screening. Osteoporosis is a condition that causes the bones to get weaker. This can make the bones weak and cause them to break more easily.  Blood pressure screening. Blood pressure changes and medicines to control blood pressure can make you feel dizzy.  Strength and balance checks. Your health care provider may recommend certain tests to check your  strength and balance while standing, walking, or changing positions.  Foot health exam. Foot pain and numbness, as well as not wearing proper footwear, can make you more likely to have a fall.  Depression screening. You may be more likely to have a fall if you have a fear of falling, feel emotionally low, or feel unable to do activities that you used to do.  Alcohol use screening. Using too much alcohol can affect your balance and may make you more likely to have a fall. What actions can I take to lower my risk of falls? General instructions  Talk with your health care provider about your risks for falling. Tell your health care provider if: ? You fall. Be sure to tell your health care provider about all falls, even ones that seem minor. ? You feel dizzy, sleepy, or off-balance.  Take over-the-counter and prescription medicines only as told by your health care provider. These include any supplements.  Eat a healthy diet and maintain a healthy weight. A healthy diet includes low-fat dairy products, low-fat (lean) meats, and fiber from whole grains, beans, and lots of fruits and vegetables. Home safety  Remove any tripping hazards, such as rugs, cords, and clutter.  Install safety equipment such as grab bars in bathrooms and safety rails on stairs.  Keep rooms and walkways well-lit. Activity   Follow a regular exercise program to stay fit. This will help you maintain your balance. Ask your health care provider what types of exercise are appropriate for you.  If you need a cane or   walker, use it as recommended by your health care provider.  Wear supportive shoes that have nonskid soles. Lifestyle  Do not drink alcohol if your health care provider tells you not to drink.  If you drink alcohol, limit how much you have: ? 0-1 drink a day for women. ? 0-2 drinks a day for men.  Be aware of how much alcohol is in your drink. In the [place] one drink equals one typical bottle of beer (12  oz), one-half glass of wine (5 oz), or one shot of hard liquor (1 oz).  Do not use any products that contain nicotine or tobacco, such as cigarettes and e-cigarettes. If you need help quitting, ask your health care provider. Summary  Having a healthy lifestyle and getting preventive care can help to protect your health and wellness after age 81.  Screening and testing are the best way to find a health problem early and help you avoid having a fall. Early diagnosis and treatment give you the best chance for managing medical conditions that are more common for people who are older than age 81.  Falls are a major cause of broken bones and head injuries in people who are older than age 81. Take precautions to prevent a fall at home.  Work with your health care provider to learn what changes you can make to improve your health and wellness and to prevent falls. This information is not intended to replace advice given to you by your health care provider. Make sure you discuss any questions you have with your health care provider. Document Released: 03/12/2017 Document Revised: 08/20/2018 Document Reviewed: 03/12/2017 Elsevier Patient Education  2020 [employer]  

## 2019-04-08 LAB — VITAMIN D 25 HYDROXY (VIT D DEFICIENCY, FRACTURES): Vit D, 25-Hydroxy: 26.2 ng/mL — ABNORMAL LOW (ref 30.0–100.0)

## 2019-04-08 LAB — LIPID PANEL
Chol/HDL Ratio: 3.1 ratio (ref 0.0–4.4)
Cholesterol, Total: 223 mg/dL — ABNORMAL HIGH (ref 100–199)
HDL: 72 mg/dL (ref >39)
LDL Chol Calc (NIH): 132 mg/dL — ABNORMAL HIGH (ref 0–99)
Triglycerides: 109 mg/dL (ref 0–149)
VLDL Cholesterol Cal: 19 mg/dL (ref 5–40)

## 2019-04-08 LAB — VITAMIN B12: Vitamin B-12: 703 pg/mL (ref 232–1245)

## 2019-04-12 ENCOUNTER — Telehealth: Payer: Self-pay

## 2019-04-12 NOTE — Telephone Encounter (Signed)
Patient advised as directed below. Labs faxed to Dr.Shah's office at [phone] [facility]

## 2019-04-12 NOTE — Telephone Encounter (Signed)
-----   Message from Mar Daring, [place] sent at 04/12/2019 10:04 AM EST ----- Cholesterol remains borderline elevated but is stable compared to last year. Continue working on healthy lifestyle modifications with limiting fatty foods, processed foods, red meats and sugars. Vit D is borderline low. Recommend to continue OTC Vit D 2000 IU daily. B12 is up to 703. Can we print B12 and fax to Neurology, Dr. Manuella Ghazi please?

## 2019-04-15 ENCOUNTER — Ambulatory Visit: Payer: Medicare Other | Attending: Neurology | Admitting: Speech Pathology

## 2019-04-15 ENCOUNTER — Other Ambulatory Visit: Payer: Self-pay

## 2019-04-15 ENCOUNTER — Encounter: Payer: Self-pay | Admitting: Speech Pathology

## 2019-04-15 DIAGNOSIS — R41841 Cognitive communication deficit: Secondary | ICD-10-CM | POA: Diagnosis present

## 2019-04-15 NOTE — Therapy (Signed)
[place] MAIN [facility] SERVICES [address] [place], [place], [address] Phone: [phone]   Fax:  [phone]  Speech Language Pathology Evaluation  Patient Details  Name: Kathryn Brooks MRN: [id number] Date of Birth: 05/29/1935 No data recorded  Encounter Date: 04/15/2019  End of Session - 04/15/19 1624    Visit Number  1    Number of Visits  17    Date for SLP Re-Evaluation  06/16/19    SLP Start Time  1501    SLP Stop Time   1603    SLP Time Calculation (min)  62 min    Activity Tolerance  Patient tolerated treatment well       Past Medical History:  Diagnosis Date  . Barrett's esophagus [id number]   biopsies were completed at 34,32,30,and 26 cm from the incisors. gastric cardia type mucosa was identified from 30-34 cm. Squamocolumnar mucosa with reflux gastroesophagitis and Barrett's esophagus was noted at 26 cm. No atypia or dysplasia was identified. The previous biopsies of December 18, 2010 at 25,30,35 cm showed similar changes. There has been no progression of the Barrett's(long segment) and  . Barrett's esophagus determined by biopsy 2012   No atypia on March 2014 biopsy.  . Blood in stool [id number]  . Breast screening, unspecified 2013  . Dementia ([place])   . GERD (gastroesophageal reflux disease) [id number]  . Obesity, unspecified 2013  . Special screening for malignant neoplasms, colon 2013  . Thyroid disease     Past Surgical History:  Procedure Laterality Date  . COLONOSCOPY N/A 2005,2012   heme positive stools  . COLONOSCOPY WITH PROPOFOL N/A 11/18/2018   Procedure: COLONOSCOPY WITH PROPOFOL;  Surgeon: Robert Bellow, MD;  Location: [facility] ENDOSCOPY;  Service: Endoscopy;  Laterality: N/A;  . ESOPHAGOGASTRODUODENOSCOPY N/A 09/28/2014   Procedure: ESOPHAGOGASTRODUODENOSCOPY (EGD);  Surgeon: Robert Bellow, MD;  Location: [facility] ENDOSCOPY;  Service: Endoscopy;  Laterality: N/A;  . ESOPHAGOGASTRODUODENOSCOPY (EGD) WITH PROPOFOL  N/A 10/30/2016   Procedure: ESOPHAGOGASTRODUODENOSCOPY (EGD) WITH PROPOFOL;  Surgeon: Robert Bellow, MD;  Location: [facility] ENDOSCOPY;  Service: Endoscopy;  Laterality: N/A;  . ESOPHAGOGASTRODUODENOSCOPY (EGD) WITH PROPOFOL N/A 11/18/2018   Procedure: ESOPHAGOGASTRODUODENOSCOPY (EGD) WITH PROPOFOL;  Surgeon: Robert Bellow, MD;  Location: [facility] ENDOSCOPY;  Service: Endoscopy;  Laterality: N/A;  . THYROIDECTOMY Right 2015  . TUBAL LIGATION    . UPPER GASTROINTESTINAL ENDOSCOPY N/A 2012   chronic grastitis, Barrett's esophagus    There were no vitals filed for this visit.  Subjective Assessment - 04/15/19 1621    Subjective  Pt was very pleasant, answering all questions to the best of her ability. Husband present and supportive.    Patient is accompained by:  Family member   husband        SLP Evaluation [facility] - 04/15/19 1624      SLP Visit Information   SLP Received On  04/15/19    Onset Date  Approx August 2016    Medical Diagnosis  Dementia      Subjective   Subjective  Pt was very pleasant and cooperative, not self aware of her memory impairments    Patient/Family Stated Goal  None stated      General Information   HPI  This 83 y/o presented to Dr. Manuella Ghazi on 02/18/2019 for evaluation of Dementia. Dr. Manuella Ghazi notes "pt stated she has more difficulty with short term memory, stating she doesn't remember things. Her husband may tell her he is going to lunch with some  friends and she will not remember. She will ask the same questions over and over again. Pt stated her husband does most of the daily activities at their house. She is still doing the laundry and folding clothes. Pt says her memory deficits do not hamper her daily functioning. Pt has no hx of head trauma or family history of dementia. Pt and husband are here seeking help for pt's memory impairments.     Balance Screen   Has the patient fallen in the past 6 months  No    Has the patient had a decrease in activity level  because of a fear of falling?   No    Is the patient reluctant to leave their home because of a fear of falling?   No      Prior Functional Status   Type of Home  House     Lives With  Spouse    Available Support  Family    Education  College Degree    Vocation  Retired   Pension scheme manager     Cognition   Overall Cognitive Status  Impaired/Different from baseline    Area of Impairment  Orientation;Attention;Memory    Orientation Level  Time;Situation    General Comments  Grossly oriented to place    Memory  Decreased short-term memory    Attention  Focused;Sustained;Selective;Alternating;Divided    Focused Attention  Impaired    Sustained Attention  Impaired    Selective Attention  Impaired    Divided Attention  Impaired    Memory  Impaired    Memory Impairment  Storage deficit;Retrieval deficit;Decreased recall of new information;Decreased short term memory;Prospective memory    Awareness  Impaired    Problem Solving  Appears intact    Executive Function  Organizing;Self Monitoring;Self Correcting    Organizing  Impaired    Self Monitoring  Impaired    Self Correcting  Impaired      Auditory Comprehension   Overall Auditory Comprehension  Appears within functional limits for tasks assessed      Reading Comprehension   Reading Status  Impaired    Paragraph Level  76-100% accurate    Effective Techniques  Visual cueing      Expression   Primary Mode of Expression  Verbal      Verbal Expression   Overall Verbal Expression  Impaired    Level of Generative/Spontaneous Verbalization  Conversation    Repetition  No impairment    Naming  Impairment    Responsive  76-100% accurate    Confrontation  75-100% accurate    Convergent  75-100% accurate    Divergent  25-49% accurate    Interfering Components  Attention    Effective Techniques  Semantic cues      Written Expression   Dominant Hand  Right    Written Expression  Exceptions to [facility]      Oral Motor/Sensory  Function   Overall Oral Motor/Sensory Function  Appears within functional limits for tasks assessed      Motor Speech   Overall Motor Speech  Appears within functional limits for tasks assessed      Standardized Assessments   Standardized Assessments   Montreal Cognitive Assessment (MOCA);Other Assessment    Montreal Cognitive Assessment (MOCA)   10/30= severe cognitive impairment        MOCA, version 8.1   Visuospatial/Executive Functioning:         2/5  Alternating Trail Making: 0/1             Visuoconstruction Skills: 0/1             Draw a clock: 2/3 Naming:                                                          3/3  Attention:                                                       1/6             Forward digit span, 5 digits: 1/1             Reverse digit span, 3 digits: 0/1             Vigilance: 0/1             Serial 7's: 0/3 Language:                                                      2/3             Verbal Fluency 0/1             Repetition: 2/2 Abstraction:                                                   1/2 Delayed Recall:                                              0/5  Memory Index Score: 4/15 Orientation:                                                    1/6   TOTAL      10/30= Mod cognitive impairment (scores of 10-17)   Cognitive Communication Assessment (Medical SLPs)  Auditory Comprehension  2 Step commands:    2/3  Multi-step commands:   1/3   Conversation (5 sentence paragraph): 3/4 Written Expression  Personal Information:   3/3  Functional Messages/Taking Notes: 0/2 Reading Comprehension  Paragraph Comprehension:  2/4  Functional Reading/Menu:   2/4 Cognition  Problem Solving:    4/4  Recent Memory:    3/3  Long Term Memory:    3/3        SLP Education - 04/15/19 1623    Education Details  re: role of SLP in cognitive training, orientation aids in home    Person(s) Educated  Patient;Spouse    Methods   Explanation;Demonstration;Verbal cues  Comprehension  Verbalized understanding;Need further instruction;Verbal cues required         SLP Long Term Goals - 04/15/19 1632      SLP LONG TERM GOAL #1   Title  Pt will use external memory aids and compensatory strategies to recall routine, personal information, and recent events to improve orientation to time and recall daily events with 80% accuracy given min cues.    Time  8    Period  Weeks    Status  New    Target Date  06/16/19      SLP LONG TERM GOAL #2   Title  Pt will recall information after given delay of increasing length (5-60 seconds) with 80% accuracy given min cues.    Time  8    Period  Weeks    Status  New    Target Date  06/16/19      SLP LONG TERM GOAL #3   Title  Pt will complete auditory and visual attention/vigilance/memory tasks with 80% accuracy given min cues.    Time  8    Period  Weeks    Status  New    Target Date  06/16/19      SLP LONG TERM GOAL #4   Title  Pt will recall facts, answer wh questions following reading sentences/short paragraphs with 80% accuracy given min cues.    Time  8    Period  Weeks    Status  New    Target Date  06/16/19       Plan - 04/15/19 1624    Clinical Impression Statement  This very pleasant 83 y/o female presents with mod to severe cognitive linguistic impairments c/b severe short term memory deficits, reduced skills in orientation, visuospatial executive functioning, attention and abstract language. Relative strengths include auditory comprehension of conversational material and functional verbal expression in conversation.  Pt will benefit from skilled ST treatment for cognitive training and education re: internal & external compensatory strategies for memory, attention and orientation to improve safety and functional independence.    Speech Therapy Frequency  2x / week    Duration  Other (comment)   8 weeks   Treatment/Interventions  SLP instruction and  feedback;Functional tasks;Cognitive reorganization;Internal/external aids;Patient/family education;Compensatory strategies;Cueing hierarchy    Potential to Achieve Goals  Good    Potential Considerations  Ability to learn/carryover information;Family/community support;Previous level of function;Cooperation/participation level    SLP Home Exercise Plan  memory and attention exercises, application of internal and external compensatory strategies    Consulted and Agree with Plan of Care  Patient;Family member/caregiver    Family Member Consulted  husband       Patient will benefit from skilled therapeutic intervention in order to improve the following deficits and impairments:   Cognitive communication deficit    Problem List Patient Active Problem List   Diagnosis Date Noted  . Vitamin B12 deficiency 11/07/2017  . Late onset Alzheimer's disease without behavioral disturbance ([place]) 07/21/2015  . Alzheimer's dementia ([place]) 05/10/2015  . Allergic rhinitis 05/05/2015  . Other obesity 05/05/2015  . Fatigue 05/05/2015  . Hypertriglyceridemia 05/05/2015  . Breath shortness 05/05/2015  . Pain in shoulder 05/05/2015  . Skin lesion 05/05/2015  . Hypothyroid 05/05/2015  . Vitamin D deficiency 05/05/2015  . Gastro-esophageal reflux disease without esophagitis 05/05/2015  . Barrett's esophagus     Ailis Rigaud, MA, CCC-SLP 04/15/2019, 4:39 PM  [place] MAIN [facility] SERVICES [address] [place], [place], [address] Phone:  [phone]   Fax:  [phone]  Name: Kathryn Brooks MRN: [id number] Date of Birth: 10-19-1935

## 2019-04-19 ENCOUNTER — Encounter: Payer: Self-pay | Admitting: Speech Pathology

## 2019-04-19 ENCOUNTER — Other Ambulatory Visit: Payer: Self-pay

## 2019-04-19 ENCOUNTER — Ambulatory Visit: Payer: Medicare Other | Admitting: Speech Pathology

## 2019-04-19 DIAGNOSIS — R41841 Cognitive communication deficit: Secondary | ICD-10-CM

## 2019-04-19 NOTE — Therapy (Signed)
[place] MAIN [facility] SERVICES [address] [place], [place], [address] Phone: [phone]   Fax:  [phone]  Speech Language Pathology Treatment  Patient Details  Name: Kathryn Brooks MRN: [id number] Date of Birth: Jan 02, 1936 No data recorded  Encounter Date: 04/19/2019  End of Session - 04/19/19 1605    Visit Number  2    Number of Visits  17    Date for SLP Re-Evaluation  06/16/19    Authorization - Visit Number  1    Authorization - Number of Visits  10    SLP Start Time  1501    SLP Stop Time   1550    SLP Time Calculation (min)  49 min    Activity Tolerance  Patient tolerated treatment well       Past Medical History:  Diagnosis Date  . Barrett's esophagus [id number]   biopsies were completed at 34,32,30,and 26 cm from the incisors. gastric cardia type mucosa was identified from 30-34 cm. Squamocolumnar mucosa with reflux gastroesophagitis and Barrett's esophagus was noted at 26 cm. No atypia or dysplasia was identified. The previous biopsies of December 18, 2010 at 25,30,35 cm showed similar changes. There has been no progression of the Barrett's(long segment) and  . Barrett's esophagus determined by biopsy 2012   No atypia on March 2014 biopsy.  . Blood in stool [id number]  . Breast screening, unspecified 2013  . Dementia ([place])   . GERD (gastroesophageal reflux disease) [id number]  . Obesity, unspecified 2013  . Special screening for malignant neoplasms, colon 2013  . Thyroid disease     Past Surgical History:  Procedure Laterality Date  . COLONOSCOPY N/A 2005,2012   heme positive stools  . COLONOSCOPY WITH PROPOFOL N/A 11/18/2018   Procedure: COLONOSCOPY WITH PROPOFOL;  Surgeon: Robert Bellow, MD;  Location: [facility] ENDOSCOPY;  Service: Endoscopy;  Laterality: N/A;  . ESOPHAGOGASTRODUODENOSCOPY N/A 09/28/2014   Procedure: ESOPHAGOGASTRODUODENOSCOPY (EGD);  Surgeon: Robert Bellow, MD;  Location: [facility] ENDOSCOPY;  Service:  Endoscopy;  Laterality: N/A;  . ESOPHAGOGASTRODUODENOSCOPY (EGD) WITH PROPOFOL N/A 10/30/2016   Procedure: ESOPHAGOGASTRODUODENOSCOPY (EGD) WITH PROPOFOL;  Surgeon: Robert Bellow, MD;  Location: [facility] ENDOSCOPY;  Service: Endoscopy;  Laterality: N/A;  . ESOPHAGOGASTRODUODENOSCOPY (EGD) WITH PROPOFOL N/A 11/18/2018   Procedure: ESOPHAGOGASTRODUODENOSCOPY (EGD) WITH PROPOFOL;  Surgeon: Robert Bellow, MD;  Location: [facility] ENDOSCOPY;  Service: Endoscopy;  Laterality: N/A;  . THYROIDECTOMY Right 2015  . TUBAL LIGATION    . UPPER GASTROINTESTINAL ENDOSCOPY N/A 2012   chronic grastitis, Barrett's esophagus    There were no vitals filed for this visit.  Subjective Assessment - 04/19/19 1602    Subjective  Pt reported she is feeling fine but that it is very cold outside.    Patient is accompained by:  Family member   husband   Currently in Pain?  No/denies            ADULT SLP TREATMENT - 04/19/19 0001      General Information   Behavior/Cognition  Alert;Cooperative;Pleasant mood    HPI  This 83 y/o presented to Dr. Manuella Ghazi on 02/18/2019 for evaluation of Dementia. Dr. Manuella Ghazi notes "pt stated she has more difficulty with short term memory, stating she doesn't remember things. Her husband may tell her he is going to lunch with some friends and she will not remember. She will ask the same questions over and over again. Pt stated her husband does most of the daily activities at their house. She  is still doing the laundry and folding clothes. Pt says her memory deficits do not hamper her daily functioning. Pt has no hx of head trauma or family history of dementia. Pt and husband are here seeking help for pt's memory impairments.      Treatment Provided   Treatment provided  Cognitive-Linquistic      Pain Assessment   Pain Assessment  No/denies pain      Cognitive-Linquistic Treatment   Treatment focused on  Cognition;Patient/family/caregiver education    Skilled Treatment   Educated pt and  husband at length re: compensatory strategies to aid short term recall and discussed home practical use. Pt and husband stated understanding. Written materials provided. Pt answered wh questions re: short sentences with 100% accuracy independently and long sentences (6-12 word sentences) with 80% accuracy given repetition x1. Pt repeated sequences of 3 numbers/words in forward order with 100% accuracy and reverse order with 60% accuracy given repetition and visual cues to "pin items on a finger" as she repeated each item. Pt completed selective attention tasks with 90% accuracy given occasional verbal cues.       Assessment / Recommendations / Plan   Plan  Continue with current plan of care      Progression Toward Goals   Progression toward goals  Progressing toward goals       SLP Education - 04/19/19 1604    Education Details  re: compensatory strategies to aid word finding    Person(s) Educated  Patient;Spouse    Methods  Explanation;Demonstration;Verbal cues;Handout    Comprehension  Verbalized understanding;Need further instruction;Returned demonstration;Verbal cues required         SLP Long Term Goals - 04/15/19 1632      SLP LONG TERM GOAL #1   Title  Pt will use external memory aids and compensatory strategies to recall routine, personal information, and recent events to improve orientation to time and recall daily events with 80% accuracy given min cues.    Time  8    Period  Weeks    Status  New    Target Date  06/16/19      SLP LONG TERM GOAL #2   Title  Pt will recall information after given delay of increasing length (5-60 seconds) with 80% accuracy given min cues.    Time  8    Period  Weeks    Status  New    Target Date  06/16/19      SLP LONG TERM GOAL #3   Title  Pt will complete auditory and visual attention/vigilance/memory tasks with 80% accuracy given min cues.    Time  8    Period  Weeks    Status  New    Target Date  06/16/19      SLP LONG TERM  GOAL #4   Title  Pt will recall facts, answer wh questions following reading sentences/short paragraphs with 80% accuracy given min cues.    Time  8    Period  Weeks    Status  New    Target Date  06/16/19       Plan - 04/19/19 1605    Clinical Impression Statement  Pt demonstrated improved short term recall given consistent cues for repetition and visualization. Pt will continue to benefit from skilled ST tx for cognitive training to improve pt safety and functional independence.   Speech Therapy Frequency  2x / week    Duration  Other (comment)   8 weeks  Treatment/Interventions  SLP instruction and feedback;Functional tasks;Cognitive reorganization;Internal/external aids;Patient/family education;Compensatory strategies;Cueing hierarchy    Potential to Achieve Goals  Good    Potential Considerations  Ability to learn/carryover information;Family/community support;Previous level of function;Cooperation/participation level    SLP Home Exercise Plan  memory and attention exercises, application of internal and external compensatory strategies    Consulted and Agree with Plan of Care  Patient;Family member/caregiver    Family Member Consulted  husband       Patient will benefit from skilled therapeutic intervention in order to improve the following deficits and impairments:   Cognitive communication deficit    Problem List Patient Active Problem List   Diagnosis Date Noted  . Vitamin B12 deficiency 11/07/2017  . Late onset Alzheimer's disease without behavioral disturbance ([place]) 07/21/2015  . Alzheimer's dementia ([place]) 05/10/2015  . Allergic rhinitis 05/05/2015  . Other obesity 05/05/2015  . Fatigue 05/05/2015  . Hypertriglyceridemia 05/05/2015  . Breath shortness 05/05/2015  . Pain in shoulder 05/05/2015  . Skin lesion 05/05/2015  . Hypothyroid 05/05/2015  . Vitamin D deficiency 05/05/2015  . Gastro-esophageal reflux disease without esophagitis 05/05/2015  . Barrett's  esophagus     Kathryn Doffing, MA, CCC-SLP 04/19/2019, 4:18 PM  [place] MAIN [facility] SERVICES [address] [place], [place], [address] Phone: [phone]   Fax:  [phone]   Name: Kathryn Brooks MRN: [id number] Date of Birth: 1936/03/08

## 2019-04-22 ENCOUNTER — Encounter: Payer: Medicare Other | Admitting: Speech Pathology

## 2019-04-22 ENCOUNTER — Encounter: Payer: Self-pay | Admitting: Speech Pathology

## 2019-04-22 ENCOUNTER — Other Ambulatory Visit: Payer: Self-pay

## 2019-04-22 ENCOUNTER — Ambulatory Visit: Payer: Medicare Other | Admitting: Speech Pathology

## 2019-04-22 DIAGNOSIS — R41841 Cognitive communication deficit: Secondary | ICD-10-CM

## 2019-04-22 NOTE — Therapy (Signed)
[place] MAIN [facility] SERVICES [address] [place], [place], [address] Phone: [phone]   Fax:  [phone]  Speech Language Pathology Treatment  Patient Details  Name: Kathryn Brooks MRN: [id number] Date of Birth: 11-22-1935 No data recorded  Encounter Date: 04/22/2019  End of Session - 04/22/19 1456    Visit Number  3    Number of Visits  17    Date for SLP Re-Evaluation  06/16/19    Authorization - Visit Number  2    Authorization - Number of Visits  10    SLP Start Time  1400    SLP Stop Time   1452    SLP Time Calculation (min)  52 min    Activity Tolerance  Patient tolerated treatment well       Past Medical History:  Diagnosis Date  . Barrett's esophagus [id number]   biopsies were completed at 34,32,30,and 26 cm from the incisors. gastric cardia type mucosa was identified from 30-34 cm. Squamocolumnar mucosa with reflux gastroesophagitis and Barrett's esophagus was noted at 26 cm. No atypia or dysplasia was identified. The previous biopsies of December 18, 2010 at 25,30,35 cm showed similar changes. There has been no progression of the Barrett's(long segment) and  . Barrett's esophagus determined by biopsy 2012   No atypia on March 2014 biopsy.  . Blood in stool [id number]  . Breast screening, unspecified 2013  . Dementia ([place])   . GERD (gastroesophageal reflux disease) [id number]  . Obesity, unspecified 2013  . Special screening for malignant neoplasms, colon 2013  . Thyroid disease     Past Surgical History:  Procedure Laterality Date  . COLONOSCOPY N/A 2005,2012   heme positive stools  . COLONOSCOPY WITH PROPOFOL N/A 11/18/2018   Procedure: COLONOSCOPY WITH PROPOFOL;  Surgeon: Robert Bellow, MD;  Location: [facility] ENDOSCOPY;  Service: Endoscopy;  Laterality: N/A;  . ESOPHAGOGASTRODUODENOSCOPY N/A 09/28/2014   Procedure: ESOPHAGOGASTRODUODENOSCOPY (EGD);  Surgeon: Robert Bellow, MD;  Location: [facility] ENDOSCOPY;  Service:  Endoscopy;  Laterality: N/A;  . ESOPHAGOGASTRODUODENOSCOPY (EGD) WITH PROPOFOL N/A 10/30/2016   Procedure: ESOPHAGOGASTRODUODENOSCOPY (EGD) WITH PROPOFOL;  Surgeon: Robert Bellow, MD;  Location: [facility] ENDOSCOPY;  Service: Endoscopy;  Laterality: N/A;  . ESOPHAGOGASTRODUODENOSCOPY (EGD) WITH PROPOFOL N/A 11/18/2018   Procedure: ESOPHAGOGASTRODUODENOSCOPY (EGD) WITH PROPOFOL;  Surgeon: Robert Bellow, MD;  Location: [facility] ENDOSCOPY;  Service: Endoscopy;  Laterality: N/A;  . THYROIDECTOMY Right 2015  . TUBAL LIGATION    . UPPER GASTROINTESTINAL ENDOSCOPY N/A 2012   chronic grastitis, Barrett's esophagus    There were no vitals filed for this visit.  Subjective Assessment - 04/22/19 1404    Subjective  Pt reported she is doing good today.    Patient is accompained by:  Family member   husband   Currently in Pain?  No/denies            ADULT SLP TREATMENT - 04/22/19 0001      General Information   Behavior/Cognition  Alert;Cooperative;Pleasant mood    HPI  This 83 y/o presented to Dr. Manuella Ghazi on 02/18/2019 for evaluation of Dementia. Dr. Manuella Ghazi notes "pt stated she has more difficulty with short term memory, stating she doesn't remember things. Her husband may tell her he is going to lunch with some friends and she will not remember. She will ask the same questions over and over again. Pt stated her husband does most of the daily activities at their house. She is still doing the laundry and  folding clothes. Pt says her memory deficits do not hamper her daily functioning. Pt has no hx of head trauma or family history of dementia. Pt and husband are here seeking help for pt's memory impairments.      Treatment Provided   Treatment provided  Cognitive-Linquistic      Pain Assessment   Pain Assessment  No/denies pain      Cognitive-Linquistic Treatment   Treatment focused on  Cognition;Patient/family/caregiver education    Skilled Treatment  Educated pt and husband re: compensatory  strategies to aid short term recall and orientation and discussed home practical use. Pt and husband stated understanding. 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 SLP provided rationale and support for regular calendar use to improve orientation to time and support functional independence.  Pt answered wh questions re: 2 short sentences with 80% accuracy given repetition x1. Pt repeated sequences of 3 numbers/words in forward order with 80% accuracy and in a named sequential order (ie heaviest to lightest) with 80% accuracy given repetition x2 and visual cues to "pin items on a finger" as she repeated each item. Pt found matches of pictured objects w/FO:3 pairs with 100% accuracy given max verbal cues for repetition and pointing, and with 75% accuracy w/FO:4 pairs given max cues.      Assessment / Recommendations / Plan   Plan  Continue with current plan of care      Progression Toward Goals   Progression toward goals  Progressing toward goals       SLP Education - 04/22/19 1456    Education Details  re: compensatory strategies to aid word finding    Person(s) Educated  Patient;Spouse    Methods  Explanation;Demonstration;Verbal cues;Handout    Comprehension  Verbalized understanding;Need further instruction;Returned demonstration;Verbal cues required         SLP Long Term Goals - 04/15/19 1632      SLP LONG TERM GOAL #1   Title  Pt will use external memory aids and compensatory strategies to recall routine, personal information, and recent events to improve orientation to time and recall daily events with 80% accuracy given min cues.    Time  8    Period  Weeks    Status  New    Target Date  06/16/19      SLP LONG TERM GOAL #2   Title  Pt will recall information after given delay of increasing length (5-60 seconds) with 80% accuracy given min cues.     Time  8    Period  Weeks    Status  New    Target Date  06/16/19      SLP LONG TERM GOAL #3   Title  Pt will complete auditory and visual attention/vigilance/memory tasks with 80% accuracy given min cues.    Time  8    Period  Weeks    Status  New    Target Date  06/16/19      SLP LONG TERM GOAL #4   Title  Pt will recall facts, answer wh questions following reading sentences/short paragraphs with 80% accuracy given min cues.    Time  8    Period  Weeks    Status  New    Target Date  06/16/19       Plan - 04/22/19 1457    Clinical  Impression Statement  Pt demonstrated improved accuracy with visual memory tasks given consistent cues for repetition and visual cues. Home practice material provided. Pt will continue to benefit from skilled ST tx for cognitive training to improve pt safety and functional independence.   Speech Therapy Frequency  2x / week    Duration  Other (comment)   8 weeks   Treatment/Interventions  SLP instruction and feedback;Functional tasks;Cognitive reorganization;Internal/external aids;Patient/family education;Compensatory strategies;Cueing hierarchy    Potential to Achieve Goals  Good    Potential Considerations  Ability to learn/carryover information;Family/community support;Previous level of function;Cooperation/participation level    SLP Home Exercise Plan  memory and attention exercises, application of internal and external compensatory strategies    Consulted and Agree with Plan of Care  Patient;Family member/caregiver    Family Member Consulted  husband       Patient will benefit from skilled therapeutic intervention in order to improve the following deficits and impairments:   Cognitive communication deficit    Problem List Patient Active Problem List   Diagnosis Date Noted  . Vitamin B12 deficiency 11/07/2017  . Late onset Alzheimer's disease without behavioral disturbance ([place]) 07/21/2015  . Alzheimer's dementia ([place]) 05/10/2015  .  Allergic rhinitis 05/05/2015  . Other obesity 05/05/2015  . Fatigue 05/05/2015  . Hypertriglyceridemia 05/05/2015  . Breath shortness 05/05/2015  . Pain in shoulder 05/05/2015  . Skin lesion 05/05/2015  . Hypothyroid 05/05/2015  . Vitamin D deficiency 05/05/2015  . Gastro-esophageal reflux disease without esophagitis 05/05/2015  . Barrett's esophagus     Breslyn Abdo, MA, CCC-SLP 04/22/2019, 5:12 PM  [place] MAIN [facility] SERVICES [address] [place], [place], [address] Phone: [phone]   Fax:  [phone]   Name: JURY VANDERSTEEN MRN: [id number] Date of Birth: September 04, 1935

## 2019-04-26 ENCOUNTER — Other Ambulatory Visit: Payer: Self-pay

## 2019-04-26 ENCOUNTER — Encounter: Payer: Self-pay | Admitting: Speech Pathology

## 2019-04-26 ENCOUNTER — Ambulatory Visit: Payer: Medicare Other | Admitting: Speech Pathology

## 2019-04-26 ENCOUNTER — Encounter: Payer: Medicare Other | Admitting: Speech Pathology

## 2019-04-26 DIAGNOSIS — R41841 Cognitive communication deficit: Secondary | ICD-10-CM

## 2019-04-26 NOTE — Therapy (Signed)
[place] MAIN [facility] SERVICES [address] [place], [place], [address] Phone: [phone]   Fax:  [phone]  Speech Language Pathology Treatment  Patient Details  Name: Kathryn Brooks MRN: [id number] Date of Birth: [birth year] No data recorded  Encounter Date: 04/26/2019  End of Session - 04/26/19 1522    Visit Number  4    Number of Visits  17    Date for SLP Re-Evaluation  06/16/19    Authorization - Visit Number  3    Authorization - Number of Visits  10    SLP Start Time  1300    SLP Stop Time   [id number]    SLP Time Calculation (min)  53 min    Activity Tolerance  Patient tolerated treatment well       Past Medical History:  Diagnosis Date  . Barrett's esophagus [id number]   biopsies were completed at 34,32,30,and 26 cm from the incisors. gastric cardia type mucosa was identified from 30-34 cm. Squamocolumnar mucosa with reflux gastroesophagitis and Barrett's esophagus was noted at 26 cm. No atypia or dysplasia was identified. The previous biopsies of December 18, 2010 at 25,30,35 cm showed similar changes. There has been no progression of the Barrett's(long segment) and  . Barrett's esophagus determined by biopsy 2012   No atypia on March 2014 biopsy.  . Blood in stool [id number]  . Breast screening, unspecified 2013  . Dementia ([place])   . GERD (gastroesophageal reflux disease) [id number]  . Obesity, unspecified 2013  . Special screening for malignant neoplasms, colon 2013  . Thyroid disease     Past Surgical History:  Procedure Laterality Date  . COLONOSCOPY N/A 2005,2012   heme positive stools  . COLONOSCOPY WITH PROPOFOL N/A 11/18/2018   Procedure: COLONOSCOPY WITH PROPOFOL;  Surgeon: Robert Bellow, MD;  Location: [facility] ENDOSCOPY;  Service: Endoscopy;  Laterality: N/A;  . ESOPHAGOGASTRODUODENOSCOPY N/A 09/28/2014   Procedure: ESOPHAGOGASTRODUODENOSCOPY (EGD);  Surgeon: Robert Bellow, MD;  Location: [facility] ENDOSCOPY;  Service:  Endoscopy;  Laterality: N/A;  . ESOPHAGOGASTRODUODENOSCOPY (EGD) WITH PROPOFOL N/A 10/30/2016   Procedure: ESOPHAGOGASTRODUODENOSCOPY (EGD) WITH PROPOFOL;  Surgeon: Robert Bellow, MD;  Location: [facility] ENDOSCOPY;  Service: Endoscopy;  Laterality: N/A;  . ESOPHAGOGASTRODUODENOSCOPY (EGD) WITH PROPOFOL N/A 11/18/2018   Procedure: ESOPHAGOGASTRODUODENOSCOPY (EGD) WITH PROPOFOL;  Surgeon: Robert Bellow, MD;  Location: [facility] ENDOSCOPY;  Service: Endoscopy;  Laterality: N/A;  . THYROIDECTOMY Right 2015  . TUBAL LIGATION    . UPPER GASTROINTESTINAL ENDOSCOPY N/A 2012   chronic grastitis, Barrett's esophagus    There were no vitals filed for this visit.  Subjective Assessment - 04/26/19 1521    Subjective  Pt reported she is doing okay.    Patient is accompained by:  Family member   husband   Currently in Pain?  No/denies            ADULT SLP TREATMENT - 04/26/19 0001      General Information   Behavior/Cognition  Alert;Cooperative;Pleasant mood    HPI  This 83 y/o presented to Dr. Manuella Ghazi on 02/18/2019 for evaluation of Dementia. Dr. Manuella Ghazi notes "pt stated she has more difficulty with short term memory, stating she doesn't remember things. Her husband may tell her he is going to lunch with some friends and she will not remember. She will ask the same questions over and over again. Pt stated her husband does most of the daily activities at their house. She is still doing the laundry and folding  clothes. Pt says her memory deficits do not hamper her daily functioning. Pt has no hx of head trauma or family history of dementia. Pt and husband are here seeking help for pt's memory impairments.      Treatment Provided   Treatment provided  Cognitive-Linquistic      Pain Assessment   Pain Assessment  No/denies pain      Cognitive-Linquistic Treatment   Treatment focused on  Cognition;Patient/family/caregiver education    Skilled Treatment  Pt repeated sequences of 3 numbers/words in a named  sequential order (ie heaviest to lightest) with 90% accuracy given repetition x3 and visual cues to "pin items on a finger" as she repeated each item, 0% given repetition x2. Pt completed divided attention tasks with 75% accuracy given regular verbal cues.       Assessment / Recommendations / Plan   Plan  Continue with current plan of care      Progression Toward Goals   Progression toward goals  Progressing toward goals       SLP Education - 04/26/19 1522    Education Details  re: compensatory strategies to aid short term memory    Person(s) Educated  Patient;Spouse    Methods  Explanation;Demonstration;Verbal cues    Comprehension  Verbalized understanding;Need further instruction;Returned demonstration;Verbal cues required         SLP Long Term Goals - 04/15/19 1632      SLP LONG TERM GOAL #1   Title  Pt will use external memory aids and compensatory strategies to recall routine, personal information, and recent events to improve orientation to time and recall daily events with 80% accuracy given min cues.    Time  8    Period  Weeks    Status  New    Target Date  06/16/19      SLP LONG TERM GOAL #2   Title  Pt will recall information after given delay of increasing length (5-60 seconds) with 80% accuracy given min cues.    Time  8    Period  Weeks    Status  New    Target Date  06/16/19      SLP LONG TERM GOAL #3   Title  Pt will complete auditory and visual attention/vigilance/memory tasks with 80% accuracy given min cues.    Time  8    Period  Weeks    Status  New    Target Date  06/16/19      SLP LONG TERM GOAL #4   Title  Pt will recall facts, answer wh questions following reading sentences/short paragraphs with 80% accuracy given min cues.    Time  8    Period  Weeks    Status  New    Target Date  06/16/19       Plan - 04/26/19 1523    Clinical Impression Statement  Pt required cues for repetition a minimum of x3 to perform short term memory and  cognitive flexibility tasks with accuracy. Home practice material provided. Pt will continue to benefit from skilled ST tx for cognitive training to improve pt safety and functional independence.   Speech Therapy Frequency  2x / week    Duration  Other (comment)   8 weeks   Treatment/Interventions  SLP instruction and feedback;Functional tasks;Cognitive reorganization;Internal/external aids;Patient/family education;Compensatory strategies;Cueing hierarchy    Potential to Achieve Goals  Good    Potential Considerations  Ability to learn/carryover information;Family/community support;Previous level of function;Cooperation/participation level    SLP Home  Exercise Plan  memory and attention exercises, application of internal and external compensatory strategies    Consulted and Agree with Plan of Care  Patient;Family member/caregiver    Family Member Consulted  husband       Patient will benefit from skilled therapeutic intervention in order to improve the following deficits and impairments:   Cognitive communication deficit    Problem List Patient Active Problem List   Diagnosis Date Noted  . Vitamin B12 deficiency 11/07/2017  . Late onset Alzheimer's disease without behavioral disturbance ([place]) 07/21/2015  . Alzheimer's dementia ([place]) 05/10/2015  . Allergic rhinitis 05/05/2015  . Other obesity 05/05/2015  . Fatigue 05/05/2015  . Hypertriglyceridemia 05/05/2015  . Breath shortness 05/05/2015  . Pain in shoulder 05/05/2015  . Skin lesion 05/05/2015  . Hypothyroid 05/05/2015  . Vitamin D deficiency 05/05/2015  . Gastro-esophageal reflux disease without esophagitis 05/05/2015  . Barrett's esophagus     Jaishon Krisher, MA, CCC-SLP 04/26/2019, 3:29 PM  [place] MAIN [facility] SERVICES [address] [place], [place], [address] Phone: [phone]   Fax:  [phone]   Name: Kathryn Brooks MRN: [id number] Date of Birth: Sep 13, 1935

## 2019-04-29 ENCOUNTER — Encounter: Payer: Medicare Other | Admitting: Speech Pathology

## 2019-04-29 ENCOUNTER — Ambulatory Visit: Payer: Medicare Other | Admitting: Speech Pathology

## 2019-05-03 ENCOUNTER — Encounter: Payer: Medicare Other | Admitting: Speech Pathology

## 2019-05-10 ENCOUNTER — Encounter: Payer: Medicare Other | Admitting: Speech Pathology

## 2019-05-10 ENCOUNTER — Ambulatory Visit: Payer: Medicare Other | Admitting: Speech Pathology

## 2019-05-13 ENCOUNTER — Ambulatory Visit: Payer: Medicare Other | Admitting: Speech Pathology

## 2019-05-13 ENCOUNTER — Encounter: Payer: Medicare Other | Admitting: Speech Pathology

## 2019-05-17 ENCOUNTER — Ambulatory Visit: Payer: Medicare PPO | Attending: Neurology | Admitting: Speech Pathology

## 2019-05-20 ENCOUNTER — Encounter: Payer: Medicare Other | Admitting: Speech Pathology

## 2019-05-24 ENCOUNTER — Encounter: Payer: Medicare Other | Admitting: Speech Pathology

## 2019-05-27 ENCOUNTER — Encounter: Payer: Medicare Other | Admitting: Speech Pathology

## 2019-10-07 ENCOUNTER — Ambulatory Visit: Payer: Medicare PPO | Admitting: Physician Assistant

## 2019-10-07 ENCOUNTER — Encounter: Payer: Self-pay | Admitting: Physician Assistant

## 2019-10-07 ENCOUNTER — Other Ambulatory Visit: Payer: Self-pay

## 2019-10-07 VITALS — BP 126/86 | HR 71 | Temp 97.0°F | Resp 16 | Wt 214.4 lb

## 2019-10-07 DIAGNOSIS — F0280 Dementia in other diseases classified elsewhere without behavioral disturbance: Secondary | ICD-10-CM

## 2019-10-07 DIAGNOSIS — E890 Postprocedural hypothyroidism: Secondary | ICD-10-CM | POA: Diagnosis not present

## 2019-10-07 DIAGNOSIS — G301 Alzheimer's disease with late onset: Secondary | ICD-10-CM

## 2019-10-07 NOTE — Patient Instructions (Signed)
Hypothyroidism  Hypothyroidism is when the thyroid gland does not make enough of certain hormones (it is underactive). The thyroid gland is a small gland located in the lower front part of the neck, just in front of the windpipe (trachea). This gland makes hormones that help control how the body uses food for energy (metabolism) as well as how the heart and brain function. These hormones also play a role in keeping your bones strong. When the thyroid is underactive, it produces too little of the hormones thyroxine (T4) and triiodothyronine (T3). What are the causes? This condition may be caused by:  Hashimoto's disease. This is a disease in which the body's disease-fighting system (immune system) attacks the thyroid gland. This is the most common cause.  Viral infections.  Pregnancy.  Certain medicines.  Birth defects.  Past radiation treatments to the head or neck for cancer.  Past treatment with radioactive iodine.  Past exposure to radiation in the environment.  Past surgical removal of part or all of the thyroid.  Problems with a gland in the center of the brain (pituitary gland).  Lack of enough iodine in the diet. What increases the risk? You are more likely to develop this condition if:  You are female.  You have a family history of thyroid conditions.  You use a medicine called lithium.  You take medicines that affect the immune system (immunosuppressants). What are the signs or symptoms? Symptoms of this condition include:  Feeling as though you have no energy (lethargy).  Not being able to tolerate cold.  Weight gain that is not explained by a change in diet or exercise habits.  Lack of appetite.  Dry skin.  Coarse hair.  Menstrual irregularity.  Slowing of thought processes.  Constipation.  Sadness or depression. How is this diagnosed? This condition may be diagnosed based on:  Your symptoms, your medical history, and a physical exam.  Blood  tests. You may also have imaging tests, such as an ultrasound or MRI. How is this treated? This condition is treated with medicine that replaces the thyroid hormones that your body does not make. After you begin treatment, it may take several weeks for symptoms to go away. Follow these instructions at home:  Take over-the-counter and prescription medicines only as told by your health care provider.  If you start taking any new medicines, tell your health care provider.  Keep all follow-up visits as told by your health care provider. This is important. ? As your condition improves, your dosage of thyroid hormone medicine may change. ? You will need to have blood tests regularly so that your health care provider can monitor your condition. Contact a health care provider if:  Your symptoms do not get better with treatment.  You are taking thyroid replacement medicine and you: ? Sweat a lot. ? Have tremors. ? Feel anxious. ? Lose weight rapidly. ? Cannot tolerate heat. ? Have emotional swings. ? Have diarrhea. ? Feel weak. Get help right away if you have:  Chest pain.  An irregular heartbeat.  A rapid heartbeat.  Difficulty breathing. Summary  Hypothyroidism is when the thyroid gland does not make enough of certain hormones (it is underactive).  When the thyroid is underactive, it produces too little of the hormones thyroxine (T4) and triiodothyronine (T3).  The most common cause is Hashimoto's disease, a disease in which the body's disease-fighting system (immune system) attacks the thyroid gland. The condition can also be caused by viral infections, medicine, pregnancy, or past   radiation treatment to the head or neck.  Symptoms may include weight gain, dry skin, constipation, feeling as though you do not have energy, and not being able to tolerate cold.  This condition is treated with medicine to replace the thyroid hormones that your body does not make. This information  is not intended to replace advice given to you by your health care provider. Make sure you discuss any questions you have with your health care provider. Document Revised: 04/11/2017 Document Reviewed: 04/09/2017 Elsevier Patient Education  2020 Elsevier Inc.  

## 2019-10-07 NOTE — Progress Notes (Signed)
Established patient visit   Patient: Kathryn Brooks   DOB: 1936-04-23   84 y.o. Female  MRN: [id number] Visit Date: 10/07/2019  Today's healthcare provider: Mar Daring, PA-C   Chief Complaint  Patient presents with  . Follow-up   Subjective    HPI Hypothyroid, follow-up  Lab Results  Component Value Date   TSH 2.460 04/01/2018   TSH 2.11 03/27/2017   TSH 2.060 05/10/2015   Wt Readings from Last 3 Encounters:  10/07/19 214 lb 6.4 oz (97.3 kg)  04/07/19 218 lb (98.9 kg)  11/18/18 221 lb 12.5 oz (100.6 kg)    She was last seen for hypothyroid 6 months ago.  Management since that visit includes continue Levothyroxine 21mcg. She reports excellent compliance with treatment. She is not having side effects.   Symptoms: No change in energy level No constipation  No diarrhea No heat / cold intolerance  No nervousness No palpitations  No weight changes    ----------------------------------------------------------------------------------------- Alzheimer's Disease: She is followed by Dr. Manuella Ghazi, Neurologist  Patient Active Problem List   Diagnosis Date Noted  . Vitamin B12 deficiency 11/07/2017  . Late onset Alzheimer's disease without behavioral disturbance ([place]) 07/21/2015  . Alzheimer's dementia ([place]) 05/10/2015  . Allergic rhinitis 05/05/2015  . Other obesity 05/05/2015  . Fatigue 05/05/2015  . Hypertriglyceridemia 05/05/2015  . Breath shortness 05/05/2015  . Pain in shoulder 05/05/2015  . Skin lesion 05/05/2015  . Hypothyroid 05/05/2015  . Vitamin D deficiency 05/05/2015  . Gastro-esophageal reflux disease without esophagitis 05/05/2015  . Barrett's esophagus    Past Medical History:  Diagnosis Date  . Barrett's esophagus [id number]   biopsies were completed at 34,32,30,and 26 cm from the incisors. gastric cardia type mucosa was identified from 30-34 cm. Squamocolumnar mucosa with reflux gastroesophagitis and Barrett's esophagus was noted at 26 cm.  No atypia or dysplasia was identified. The previous biopsies of December 18, 2010 at 25,30,35 cm showed similar changes. There has been no progression of the Barrett's(long segment) and  . Barrett's esophagus determined by biopsy 2012   No atypia on March 2014 biopsy.  . Blood in stool [id number]  . Breast screening, unspecified 2013  . Dementia ([place])   . GERD (gastroesophageal reflux disease) [id number]  . Obesity, unspecified 2013  . Special screening for malignant neoplasms, colon 2013  . Thyroid disease        Medications: Outpatient Medications Prior to Visit  Medication Sig  . aspirin 81 MG tablet Take 81 mg by mouth daily.  . cholecalciferol (VITAMIN D) 1000 units tablet Take 2,000 Units by mouth 2 (two) times daily.   Marland Kitchen donepezil (ARICEPT) 10 MG tablet Take 10 mg by mouth at bedtime.   Marland Kitchen levothyroxine (SYNTHROID, LEVOTHROID) 25 MCG tablet Take 25 mcg by mouth daily before breakfast.   . Memantine HCl (NAMENDA XR PO) Take 1 tablet by mouth 2 (two) times daily.   Marland Kitchen omeprazole (PRILOSEC) 40 MG capsule Take 1 capsule by mouth daily as needed. Acid reflux  . vitamin B-12 (CYANOCOBALAMIN) 1000 MCG tablet Take 1,000 mcg by mouth in the morning and at bedtime.    No facility-administered medications prior to visit.    Review of Systems  Constitutional: Negative.   Respiratory: Negative.   Cardiovascular: Negative.   Neurological: Negative.   Psychiatric/Behavioral: Negative.     Last CBC Lab Results  Component Value Date   WBC 6.0 04/01/2018   HGB 13.8 04/01/2018   HCT 40.4 04/01/2018   MCV  88 04/01/2018   MCH 30.0 04/01/2018   RDW 12.8 04/01/2018   PLT 249 [id number]   Last metabolic panel Lab Results  Component Value Date   GLUCOSE 90 04/01/2018   NA 142 04/01/2018   K 4.0 04/01/2018   CL 102 04/01/2018   CO2 23 04/01/2018   BUN 15 04/01/2018   CREATININE 0.98 04/01/2018   GFRNONAA 55 (L) 04/01/2018   GFRAA 63 04/01/2018   CALCIUM 9.2 04/01/2018   PROT 6.8  04/01/2018   ALBUMIN 4.1 04/01/2018   LABGLOB 2.7 04/01/2018   AGRATIO 1.5 04/01/2018   BILITOT 0.3 04/01/2018   ALKPHOS 86 04/01/2018   AST 15 04/01/2018   ALT 13 04/01/2018   ANIONGAP 8 06/05/2016      Objective    BP 126/86 (BP Location: Left Arm, Patient Position: Sitting, Cuff Size: Large)   Pulse 71   Temp (!) 97 F (36.1 C) (Temporal)   Resp 16   Wt 214 lb 6.4 oz (97.3 kg)   BMI 29.08 kg/m  BP Readings from Last 3 Encounters:  10/07/19 126/86  04/07/19 (!) 160/87  11/18/18 117/73   Wt Readings from Last 3 Encounters:  10/07/19 214 lb 6.4 oz (97.3 kg)  04/07/19 218 lb (98.9 kg)  11/18/18 221 lb 12.5 oz (100.6 kg)      Physical Exam Vitals reviewed.  Constitutional:      General: She is not in acute distress.    Appearance: Normal appearance. She is well-developed. She is not ill-appearing or diaphoretic.  Cardiovascular:     Rate and Rhythm: Normal rate and regular rhythm.     Pulses: Normal pulses.     Heart sounds: Normal heart sounds. No murmur. No friction rub. No gallop.   Pulmonary:     Effort: Pulmonary effort is normal. No respiratory distress.     Breath sounds: Normal breath sounds. No wheezing or rales.  Musculoskeletal:     Cervical back: Normal range of motion and neck supple.  Neurological:     Mental Status: She is alert.  Psychiatric:        Mood and Affect: Mood normal.      No results found for any visits on 10/07/19.  Assessment & Plan     1. Postoperative hypothyroidism Stable on Levothyroxine 18mcg. Will check labs as below and f/u pending results. Return in 6 months for AWV/CPE.  - T4 AND TSH  2. Late onset Alzheimer's disease without behavioral disturbance ([facility]) Stable. Continue Aricept and Namenda as prescribed by Neurology.    No follow-ups on file.      Reynolds Bowl, PA-C, have reviewed all documentation for this visit. The documentation on 10/08/19 for the exam, diagnosis, procedures, and orders are all  accurate and complete.   Rubye Beach  [facility] [phone] (phone) [phone] (fax)  [place]

## 2019-10-08 ENCOUNTER — Other Ambulatory Visit: Payer: Self-pay

## 2019-10-08 DIAGNOSIS — E890 Postprocedural hypothyroidism: Secondary | ICD-10-CM

## 2019-10-08 LAB — T4 AND TSH
T4, Total: 8.9 ug/dL (ref 4.5–12.0)
TSH: 4.56 u[IU]/mL — ABNORMAL HIGH (ref 0.450–4.500)

## 2019-10-08 MED ORDER — LEVOTHYROXINE SODIUM 25 MCG PO TABS: 25.0000 ug | ORAL_TABLET | Freq: Every day | ORAL | 1 refills | Status: DC

## 2019-10-08 NOTE — Telephone Encounter (Signed)
Called husband number no answer to let him know of patient's lab results since patient is not able to remember and to see if she needs more refills on the Levothyroxine. I did called the patient and advised of lab results but need to get in contact with her husband.

## 2019-10-08 NOTE — Telephone Encounter (Signed)
Patient's husband called and given results as noted below, he verbalized understanding and says she will need a refill sent to [facility] in [place], [place] - [address] [phone] (Phone) [phone] (Fax)

## 2019-10-08 NOTE — Telephone Encounter (Signed)
Requested medication (s) are due for refill today: Yes  Requested medication (s) are on the active medication list: Yes  Last refill:  Unknown  Future visit scheduled: Yes  Notes to clinic:  Unable to refill per protocol, expired Rx refilled by historical provider     Requested Prescriptions  Pending Prescriptions Disp Refills   levothyroxine (SYNTHROID) 25 MCG tablet 90 tablet 1    Sig: Take 1 tablet (25 mcg total) by mouth daily before breakfast.      Endocrinology:  Hypothyroid Agents Failed - 10/08/2019  1:40 PM      Failed - TSH needs to be rechecked within 3 months after an abnormal result. Refill until TSH is due.      Failed - TSH in normal range and within 360 days    TSH  Date Value Ref Range Status  10/07/2019 4.560 (H) 0.450 - 4.500 uIU/mL Final          Passed - Valid encounter within last 12 months    Recent Outpatient Visits           Yesterday Postoperative hypothyroidism   [facility] [place], Clearnce Sorrel, [place]   6 months ago Annual physical exam   [facility] [place] M, [place]   11 months ago Late onset Alzheimer's disease without behavioral disturbance [facility])   [place], Clearnce Sorrel, [place]   1 year ago Annual physical exam   [facility] [place], [place]ce Sorrel, [place]   2 years ago Annual physical exam   [facility] [place] M[place] M, [place]

## 2019-10-08 NOTE — Telephone Encounter (Signed)
-----   Message from Mar Daring, [place] sent at 10/08/2019  7:24 AM EDT ----- TSH level is slightly under-corrected but T4 (what is used in the body) is normal. Would recommend to continue levothyroxine 81mcg.

## 2020-04-10 ENCOUNTER — Encounter: Payer: Self-pay | Admitting: Physician Assistant

## 2020-04-10 NOTE — Progress Notes (Deleted)
{This patient's chart is due for periodic physician review. Please check 'Cosign Required' and forward to your supervising physician.:1}  Annual Wellness Visit     Patient: Kathryn Brooks, Female    DOB: Nov 10, 1935, 84 y.o.   MRN: [id number] Visit Date: 04/10/2020  Today's Provider: Mar Daring, PA-C   No chief complaint on file.  Subjective    Kathryn Brooks is a 84 y.o. female who presents today for her Annual Wellness Visit. She reports consuming a {diet types:17450} diet. {Exercise:19826} She generally feels {well/fairly well/poorly:18703}. She reports sleeping {well/fairly well/poorly:18703}. She {does/does not:200015} have additional problems to discuss today.   HPI   {Show patient history (optional):23778::" "}   Medications: Outpatient Medications Prior to Visit  Medication Sig  . aspirin 81 MG tablet Take 81 mg by mouth daily.  . cholecalciferol (VITAMIN D) 1000 units tablet Take 2,000 Units by mouth 2 (two) times daily.   Marland Kitchen donepezil (ARICEPT) 10 MG tablet Take 10 mg by mouth at bedtime.   Marland Kitchen levothyroxine (SYNTHROID) 25 MCG tablet Take 1 tablet (25 mcg total) by mouth daily before breakfast.  . Memantine HCl (NAMENDA XR PO) Take 1 tablet by mouth 2 (two) times daily.   Marland Kitchen omeprazole (PRILOSEC) 40 MG capsule Take 1 capsule by mouth daily as needed. Acid reflux  . vitamin B-12 (CYANOCOBALAMIN) 1000 MCG tablet Take 1,000 mcg by mouth in the morning and at bedtime.    No facility-administered medications prior to visit.    No Known Allergies  Patient Care Team: Mar Daring, PA-C as PCP - General (Family Medicine) Bary Castilla, Forest Gleason, MD (General Surgery) Carloyn Manner, MD as Referring Physician (Otolaryngology) Anell Barr, OD as Consulting Physician (Optometry)  Review of Systems  Constitutional: Negative.   HENT: Negative.   Eyes: Negative.   Respiratory: Negative.   Cardiovascular: Negative.   Gastrointestinal: Negative.    Endocrine: Negative.   Genitourinary: Negative.   Musculoskeletal: Negative.   Skin: Negative.   Allergic/Immunologic: Negative.   Neurological: Negative.   Hematological: Negative.   Psychiatric/Behavioral: Negative.     {Heme  Chem  Endocrine  Serology  Results Review (optional):23779::" "}  Objective    Vitals: There were no vitals taken for this visit. {Show previous vital signs (optional):23777::" "}  Physical Exam ***  Most recent functional status assessment: No flowsheet data found. Most recent fall risk assessment: Fall Risk  03/11/2019  Falls in the past year? 0  Number falls in past yr: 0  Injury with Fall? 0  Follow up -    Most recent depression screenings: PHQ 2/9 Scores 03/11/2019 03/09/2018  PHQ - 2 Score 0 0   Most recent cognitive screening: 6CIT Screen 04/01/2018  What Year? 4 points  What month? 3 points  What time? 0 points  Count back from 20 0 points  Months in reverse 2 points  Repeat phrase 2 points  Total Score 11   Most recent Audit-C alcohol use screening No flowsheet data found. A score of 3 or more in women, and 4 or more in men indicates increased risk for alcohol abuse, EXCEPT if all of the points are from question 1   No results found for any visits on 04/10/20.  Assessment & Plan     Annual wellness visit done today including the all of the following: Reviewed patient's Family Medical History Reviewed and updated list of patient's medical providers Assessment of cognitive impairment was done Assessed patient's functional ability Established a written  schedule for health screening services Health Risk Assessent Completed and Reviewed  Exercise Activities and Dietary recommendations Goals    . Increase water intake     Recommend increasing water intake to 4-6 glasses a day.     Marland Kitchen LIFESTYLE - DECREASE FALLS RISK     Recommend to remove any items from the home that may cause slips or trips.            Immunization History  Administered Date(s) Administered  . Fluad Quad(high Dose 65+) 04/07/2019  . Influenza, High Dose Seasonal PF 02/09/2015, 03/09/2018  . Moderna SARS-COVID-2 Vaccination 07/08/2019, 08/05/2019  . Pneumococcal Conjugate-13 08/01/2014  . Pneumococcal Polysaccharide-23 11/30/2010  . Td 06/21/2004    Health Maintenance  Topic Date Due  . TETANUS/TDAP  06/21/2014  . INFLUENZA VACCINE  12/12/2019  . DEXA SCAN  Completed  . COVID-19 Vaccine  Completed  . PNA vac Low Risk Adult  Completed     Discussed health benefits of physical activity, and encouraged her to engage in regular exercise appropriate for her age and condition.    ***  No follow-ups on file.     {provider attestation***:1}   Rubye Beach  [facility] [phone] (phone) [phone] (fax)  [place]

## 2020-06-07 ENCOUNTER — Telehealth: Payer: Self-pay | Admitting: Physician Assistant

## 2020-06-07 NOTE — Telephone Encounter (Signed)
Copied from [place] [phone]. Topic: Medicare AWV >> Jun 07, 2020  2:32 PM Cher Nakai R wrote: Reason for CRM:   Left message for patient to call back and schedule Medicare Annual Wellness Visit (AWV) in office.   If not able to come in office, please offer to do virtually or by telephone.   Last AWV 03/11/2019  Please schedule at anytime with [facility] Health Advisor.  If any questions, please contact me at [phone]

## 2020-07-25 ENCOUNTER — Telehealth: Payer: Self-pay | Admitting: Physician Assistant

## 2020-07-25 NOTE — Telephone Encounter (Signed)
Patient declined the Medicare Wellness Visit with [facility] - stated that she does not want to have this completed any longer and hung up

## 2020-11-21 ENCOUNTER — Other Ambulatory Visit: Payer: Self-pay | Admitting: General Surgery

## 2020-11-21 NOTE — Progress Notes (Signed)
Subjective:     Patient ID: Kathryn Brooks is a 85 y.o. female.   HPI   The following portions of the patient's history were reviewed and updated as appropriate.   This an established patient is here today for: office visit. Here to discuss having an EGD, history of Barrett's, last completed 11-18-2018.   She denies any trouble swallowing.   The patient has had Alzheimer's for some time, but her husband reports no change in her baseline over the last year.  She is somewhat repetitive on questions such as the time, but otherwise is able to follow conversations.   She is here with her husband, Francee Piccolo.   Review of Systems  Constitutional: Negative for chills and fever.  HENT: Negative.   Respiratory: Negative.  Negative for cough.   Cardiovascular: Negative.   Gastrointestinal: Negative.          Chief Complaint  Patient presents with   Pre-op Exam      BP 132/62   Pulse 73   Temp 36.4 C (97.6 F)   Ht 167.6 cm (5\' 6" )   Wt 90.7 kg (200 lb)   SpO2 97%   BMI 32.28 kg/m     The patient's weight at the time of her November 10, 2018 exam was 221+ pounds.       Past Medical History:  Diagnosis Date   Barrett esophagus     Dementia (CMS-HCC)     GERD (gastroesophageal reflux disease)     Thyroid disease             Past Surgical History:  Procedure Laterality Date   COLONOSCOPY   11/18/2018    Moderate active colitis throughout the colon on random biopsy.  Atypical cells noted in the transverse colon without dysplasia or malignancy.   COLONOSCOPY        2005, 2012   EGD   11/18/2018    Ulceration and fibropurulent disease consistent with reflux at 25 cm, intestinal metaplasia 27 cm without increased eosinophils, intestinal metaplasia at 30 cm.  Metaplasia noted at 35 cm.   EGD   2012   LAPAROSCOPIC TUBAL LIGATION       OTHER SURGERY        thyroid   THYROIDECTOMY TOTAL Right 2015        OB History   No obstetric history on file.     Obstetric Comments  Age at  first period Age of first pregnancy             Social History          Socioeconomic History   Marital status: Married  Tobacco Use   Smoking status: Never Smoker   Smokeless tobacco: Never Used  Scientific laboratory technician Use: Never used  Substance and Sexual Activity   Alcohol use: No   Drug use: No   Sexual activity: Not Currently  Social History Narrative    Education: Secretary/administrator    Occupation: retired    Hobbies: na    Marital Status:         No Known Allergies   Current Medications        Current Outpatient Medications  Medication Sig Dispense Refill   cholecalciferol (VITAMIN D3) 1000 unit tablet Take 1,000 Units by mouth once daily       cyanocobalamin (VITAMIN B12) 1000 MCG tablet Take 1,000 mcg by mouth once daily       levothyroxine (SYNTHROID, LEVOTHROID) 25 MCG tablet TK 1 T  PO QD   6   memantine (NAMENDA) 10 MG tablet Take 1 tablet (10 mg total) by mouth 2 (two) times daily for 180 days 180 tablet 1   omeprazole (PRILOSEC) 40 MG DR capsule Take 40 mg by mouth once daily       aspirin 81 MG EC tablet Take by mouth. (Patient not taking: Reported on 11/21/2020)       donepeziL (ARICEPT) 10 MG tablet Take 1 tablet (10 mg total) by mouth nightly (Patient not taking: Reported on 11/21/2020) 180 tablet 1    No current facility-administered medications for this visit.             Family History  Problem Relation Age of Onset   Cancer Mother     Colon cancer Mother     Stroke Maternal Grandmother               Objective:   Physical Exam Exam conducted with a chaperone present.  Constitutional:      Appearance: Normal appearance.  Cardiovascular:     Rate and Rhythm: Normal rate and regular rhythm.     Pulses: Normal pulses.     Heart sounds: Normal heart sounds.  Pulmonary:     Effort: Pulmonary effort is normal.     Breath sounds: Normal breath sounds.  Musculoskeletal:     Cervical back: Neck supple.  Skin:    General: Skin is warm and dry.   Neurological:     Mental Status: She is alert and oriented to person, place, and time.  Psychiatric:        Mood and Affect: Mood normal.        Behavior: Behavior normal.      Labs and Radiology:    November 17, 2020 EGD biopsy results:   DIAGNOSIS:  A. ESOPHAGUS, 25 CM; COLD BIOPSY:  - ULCERATION AND FIBRINOPURULENT DEBRIS.  - SQUAMOUS MUCOSA WITH FEATURES OF REFLUX GASTROESOPHAGITIS.  - NO INCREASE IN INTRAEPITHELIAL EOSINOPHILS (LESS THAN 2 PER HPF).  - COLUMNAR MUCOSA WITH FOCAL GOBLET CELL CHANGE.  - NEGATIVE FOR DYSPLASIA AND MALIGNANCY.   B. ESOPHAGUS, 27 CM; COLD BIOPSY:  - COLUMNAR MUCOSA WITH INTESTINAL METAPLASIA.  - SQUAMOUS MUCOSA WITH FEATURES OF REFLUX GASTROESOPHAGITIS.  - NO INCREASE IN INTRAEPITHELIAL EOSINOPHILS (LESS THAN 2 PER HPF).  - NEGATIVE FOR DYSPLASIA AND MALIGNANCY.   C. ESOPHAGUS, 30 CM; COLD BIOPSY:  - COLUMNAR MUCOSA WITH INTESTINAL METAPLASIA.  - NEGATIVE FOR DYSPLASIA AND MALIGNANCY.   D. ESOPHAGUS, 33 CM; COLD BIOPSY:  - COLUMNAR MUCOSA WITH INTESTINAL METAPLASIA.  - NEGATIVE FOR DYSPLASIA AND MALIGNANCY.      Colonoscopy completed the same date showed diffuse inflammatory changes from which the patient was asymptomatic.   Marland Kitchen COLON, RANDOM RIGHT; COLD BIOPSY:  - MODERATE ACTIVE COLITIS WITH ARCHITECTURAL FEATURES OF CHRONICITY  (CRYPTITIS AND CRYPT ABSCESSES).  - NEGATIVE FOR GRANULOMA, DYSPLASIA, AND MALIGNANCY   F. COLON POLYP, TRANSVERSE; COLD BIOPSY:  - POLYPOID FRAGMENTS OF INFLAMED COLONIC MUCOSA WITH ATYPICAL CELLS.  - NEGATIVE FOR GRANULOMA, DYSPLASIA, AND MALIGNANCY.   G. COLON, RANDOM DESCENDING; COLD BIOPSY:  - MODERATE ACTIVE COLITIS WITH ARCHITECTURAL FEATURES OF CHRONICITY  (CRYPTITIS AND CRYPT ABSCESSES).   Comment:  Sampling of the esophagus (parts A-D) shows evidence of goblet cell  change throughout, without dysplasia and malignancy. The findings are  consistent with the patient's established history of Barrett  esophagus.  Specimen A displays ulceration and acute fibrinopurulent debris.  Immunohistochemical studies with appropriately reactive  controls are  negative for CMV and HSV 1/2.  A GMS special stain is negative for  fungal elements.   Sampling of the colon (parts E and G) displays evidence of moderately  active colitis, with architectural features of chronicity, including  crypt branching, as well as Paneth cell metaplasia. Stool studies are  recommended to rule out an infectious process.   The polypoid lesion of the transverse colon (specimen F) shows a  significant inflammatory infiltrate, with large atypical cells.  Immunohistochemical studies with appropriately reactive controls were  attempted. However, these studies are limited by the presence of only a  small amount of residual atypical tissue. Within this area, there is  diffuse staining for CD3+ T cells and CD20+ B cells. An immunostain for  CMV is negative. An Epstein-Barr virus encoded RNA probe (EBER) is  negative.   This significance of this tissue fragment in specimen F is unclear. The  differential diagnosis may include severely inflamed tissue, or a  possible lymphoproliferative process. Clinical and radiographic  correlation recommended.   IHC slides were prepared by [facility], [place], [place]. All controls stained appropriately.         Assessment:     History of Barrett's esophagus, for surveillance.   Stable dementia.   History of colitis without clinical symptomatology, observation only.    Plan:     The role of EGD was reviewed with the patient and her husband.  They are amenable to proceed.      This note is partially prepared by Karie Fetch, RN, acting as a scribe in the presence of Dr. Hervey Ard, MD.  The documentation recorded by the scribe accurately reflects the service I personally performed and the decisions made by me.    Robert Bellow, MD  FACS

## 2020-12-04 ENCOUNTER — Other Ambulatory Visit: Payer: Self-pay | Admitting: Physician Assistant

## 2020-12-04 DIAGNOSIS — E890 Postprocedural hypothyroidism: Secondary | ICD-10-CM

## 2020-12-14 ENCOUNTER — Encounter: Payer: Self-pay | Admitting: General Surgery

## 2020-12-15 ENCOUNTER — Ambulatory Visit: Payer: Medicare PPO | Admitting: Anesthesiology

## 2020-12-15 ENCOUNTER — Encounter
Admission: RE | Disposition: A | Discharge status: Home / Self Care | Payer: Self-pay | Source: Home / Self Care | Attending: General Surgery

## 2020-12-15 ENCOUNTER — Ambulatory Visit
Admission: RE | Admit: 2020-12-15 | Discharge: 2020-12-15 | Disposition: A | Discharge status: Home / Self Care | Payer: Medicare PPO | Attending: General Surgery | Admitting: General Surgery

## 2020-12-15 DIAGNOSIS — Z79899 Other long term (current) drug therapy: Secondary | ICD-10-CM | POA: Diagnosis not present

## 2020-12-15 DIAGNOSIS — F0390 Unspecified dementia without behavioral disturbance: Secondary | ICD-10-CM | POA: Diagnosis not present

## 2020-12-15 DIAGNOSIS — E890 Postprocedural hypothyroidism: Secondary | ICD-10-CM | POA: Diagnosis not present

## 2020-12-15 DIAGNOSIS — E669 Obesity, unspecified: Secondary | ICD-10-CM | POA: Insufficient documentation

## 2020-12-15 DIAGNOSIS — Z8719 Personal history of other diseases of the digestive system: Secondary | ICD-10-CM | POA: Insufficient documentation

## 2020-12-15 DIAGNOSIS — K2270 Barrett's esophagus without dysplasia: Secondary | ICD-10-CM | POA: Diagnosis present

## 2020-12-15 DIAGNOSIS — Z7982 Long term (current) use of aspirin: Secondary | ICD-10-CM | POA: Insufficient documentation

## 2020-12-15 DIAGNOSIS — K31A19 Gastric intestinal metaplasia without dysplasia, unspecified site: Secondary | ICD-10-CM | POA: Insufficient documentation

## 2020-12-15 DIAGNOSIS — Z79890 Hormone replacement therapy (postmenopausal): Secondary | ICD-10-CM | POA: Insufficient documentation

## 2020-12-15 DIAGNOSIS — K449 Diaphragmatic hernia without obstruction or gangrene: Secondary | ICD-10-CM | POA: Diagnosis not present

## 2020-12-15 DIAGNOSIS — Z6825 Body mass index (BMI) 25.0-25.9, adult: Secondary | ICD-10-CM | POA: Insufficient documentation

## 2020-12-15 DIAGNOSIS — K2100 Gastro-esophageal reflux disease with esophagitis, without bleeding: Secondary | ICD-10-CM | POA: Diagnosis not present

## 2020-12-15 HISTORY — PX: ESOPHAGOGASTRODUODENOSCOPY (EGD) WITH PROPOFOL: SHX5813

## 2020-12-15 SURGERY — ESOPHAGOGASTRODUODENOSCOPY (EGD) WITH PROPOFOL
Anesthesia: General

## 2020-12-15 MED ORDER — LIDOCAINE HCL (CARDIAC) PF 100 MG/5ML IV SOSY
PREFILLED_SYRINGE | INTRAVENOUS | Status: DC | PRN
  Administered 2020-12-15: 50 mg via INTRAVENOUS

## 2020-12-15 MED ORDER — OMEPRAZOLE 40 MG PO CPDR: 40.0000 mg | DELAYED_RELEASE_CAPSULE | Freq: Every day | ORAL | 0 refills | Status: AC

## 2020-12-15 MED ORDER — PROPOFOL 500 MG/50ML IV EMUL
INTRAVENOUS | Status: DC | PRN
  Administered 2020-12-15: 160 ug/kg/min via INTRAVENOUS

## 2020-12-15 MED ORDER — PROPOFOL 10 MG/ML IV BOLUS
INTRAVENOUS | Status: DC | PRN
  Administered 2020-12-15: 70 mg via INTRAVENOUS

## 2020-12-15 MED ORDER — PROPOFOL 500 MG/50ML IV EMUL
INTRAVENOUS | Status: AC
  Filled 2020-12-15: qty 50

## 2020-12-15 MED ORDER — SODIUM CHLORIDE 0.9 % IV SOLN
INTRAVENOUS | Status: DC
  Administered 2020-12-15: 20 mL/h via INTRAVENOUS

## 2020-12-15 NOTE — Anesthesia Preprocedure Evaluation (Signed)
Anesthesia Evaluation  Patient identified by MRN, date of birth, ID band Patient confused  General Assessment Comment:Patient AOx2 (doesn't know her birth year). Also does not know which procedure she is here for.  Reviewed: Allergy & Precautions, H&P , NPO status , Patient's Chart, lab work & pertinent test results  Airway Mallampati: II  TM Distance: >3 FB Neck ROM: Full    Dental  (+) Upper Dentures, Partial Lower   Pulmonary neg pulmonary ROS, neg shortness of breath, neg COPD, neg recent URI, Not current smoker,    breath sounds clear to auscultation       Cardiovascular (-) angina(-) Past MI, (-) Cardiac Stents and (-) CHF negative cardio ROS  (-) dysrhythmias  Rhythm:Regular Rate:Normal     Neuro/Psych PSYCHIATRIC DISORDERS Dementia negative neurological ROS     GI/Hepatic Neg liver ROS, GERD (no symptoms today)  Controlled,Barrett's esophagus   Endo/Other  Hypothyroidism   Renal/GU negative Renal ROS  negative genitourinary   Musculoskeletal   Abdominal   Peds  Hematology negative hematology ROS (+)   Anesthesia Other Findings Past Medical History: [id number]: Barrett's esophagus     Comment:  biopsies were completed at 34,32,30,and 26 cm from the               incisors. gastric cardia type mucosa was identified from               30-34 cm. Squamocolumnar mucosa with reflux               gastroesophagitis and Barrett's esophagus was noted at 26              cm. No atypia or dysplasia was identified. The previous               biopsies of December 18, 2010 at 25,30,35 cm showed similar               changes. There has been no progression of the               Barrett's(long segment) and 2012: Barrett's esophagus determined by biopsy     Comment:  No atypia on March 2014 biopsy. [id number]: Blood in stool 2013: Breast screening, unspecified [id number]: GERD (gastroesophageal reflux disease) 2013: Obesity,  unspecified 2013: Special screening for malignant neoplasms, colon No date: Thyroid disease  Past Surgical History: 2005,2012: COLONOSCOPY; N/A     Comment:  heme positive stools 09/28/2014: ESOPHAGOGASTRODUODENOSCOPY; N/A     Comment:  Procedure: ESOPHAGOGASTRODUODENOSCOPY (EGD);  Surgeon:               Robert Bellow, MD;  Location: [facility] ENDOSCOPY;                Service: Endoscopy;  Laterality: N/A; 10/30/2016: ESOPHAGOGASTRODUODENOSCOPY (EGD) WITH PROPOFOL; N/A     Comment:  Procedure: ESOPHAGOGASTRODUODENOSCOPY (EGD) WITH               PROPOFOL;  Surgeon: Robert Bellow, MD;  Location:               [facility] ENDOSCOPY;  Service: Endoscopy;  Laterality: N/A; 2015: THYROIDECTOMY; Right No date: TUBAL LIGATION 2012: UPPER GASTROINTESTINAL ENDOSCOPY; N/A     Comment:  chronic grastitis, Barrett's esophagus     Reproductive/Obstetrics negative OB ROS                             Anesthesia Physical  Anesthesia Plan  ASA:  3  Anesthesia Plan: General   Post-op Pain Management:    Induction: Intravenous  PONV Risk Score and Plan: 3 and Propofol infusion and TIVA  Airway Management Planned: Natural Airway and Nasal Cannula  Additional Equipment: None  Intra-op Plan:   Post-operative Plan:   Informed Consent: I have reviewed the patients History and Physical, chart, labs and discussed the procedure including the risks, benefits and alternatives for the proposed anesthesia with the patient or authorized representative who has indicated his/her understanding and acceptance.     Dental Advisory Given and Consent reviewed with POA  Plan Discussed with: Anesthesiologist and CRNA  Anesthesia Plan Comments: (Discussed risks of anesthesia with patient and her daughter at bedside, including possibility of difficulty with spontaneous ventilation under anesthesia necessitating airway intervention, PONV, Post operative cognitive dysfunction, and rare  risks such as cardiac or respiratory or neurological events, and allergic reactions. They understands.)        Anesthesia Quick Evaluation

## 2020-12-15 NOTE — Anesthesia Procedure Notes (Signed)
Date/Time: 12/15/2020 8:44 AM Performed by: Johnna Acosta, CRNA Pre-anesthesia Checklist: Patient identified, Emergency Drugs available, Suction available and Patient being monitored Patient Re-evaluated:Patient Re-evaluated prior to induction Oxygen Delivery Method: Nasal cannula Preoxygenation: Pre-oxygenation with 100% oxygen Induction Type: IV induction

## 2020-12-15 NOTE — H&P (Signed)
Kathryn Brooks [id number] 12/01/1935     HPI:  85 y/o woman with stable dementia and Barrett's esophageal changes. For surveillance EGD.   Medications Prior to Admission  Medication Sig Dispense Refill Last Dose   aspirin 81 MG tablet Take 81 mg by mouth daily.   12/14/2020   cholecalciferol (VITAMIN D) 1000 units tablet Take 2,000 Units by mouth 2 (two) times daily.    12/14/2020   donepezil (ARICEPT) 10 MG tablet Take 10 mg by mouth at bedtime.    12/14/2020   levothyroxine (SYNTHROID) 25 MCG tablet Take 1 tablet (25 mcg total) by mouth daily before breakfast. 90 tablet 1 12/14/2020   Memantine HCl (NAMENDA XR PO) Take 1 tablet by mouth 2 (two) times daily.    12/14/2020   omeprazole (PRILOSEC) 40 MG capsule Take 1 capsule by mouth daily as needed. Acid reflux  0 12/14/2020   vitamin B-12 (CYANOCOBALAMIN) 1000 MCG tablet Take 1,000 mcg by mouth in the morning and at bedtime.    12/14/2020   No Known Allergies Past Medical History:  Diagnosis Date   Barrett's esophagus [id number]   biopsies were completed at 34,32,30,and 26 cm from the incisors. gastric cardia type mucosa was identified from 30-34 cm. Squamocolumnar mucosa with reflux gastroesophagitis and Barrett's esophagus was noted at 26 cm. No atypia or dysplasia was identified. The previous biopsies of December 18, 2010 at 25,30,35 cm showed similar changes. There has been no progression of the Barrett's(long segment) and   Barrett's esophagus determined by biopsy 2012   No atypia on March 2014 biopsy.   Blood in stool [id number]   Breast screening, unspecified 2013   Dementia ([place])    GERD (gastroesophageal reflux disease) [id number]   Obesity, unspecified 2013   Special screening for malignant neoplasms, colon 2013   Thyroid disease    Past Surgical History:  Procedure Laterality Date   COLONOSCOPY N/A 2005,2012   heme positive stools   COLONOSCOPY WITH PROPOFOL N/A 11/18/2018   Procedure: COLONOSCOPY WITH PROPOFOL;  Surgeon: Robert Bellow,  MD;  Location: [facility] ENDOSCOPY;  Service: Endoscopy;  Laterality: N/A;   ESOPHAGOGASTRODUODENOSCOPY N/A 09/28/2014   Procedure: ESOPHAGOGASTRODUODENOSCOPY (EGD);  Surgeon: Robert Bellow, MD;  Location: [facility] ENDOSCOPY;  Service: Endoscopy;  Laterality: N/A;   ESOPHAGOGASTRODUODENOSCOPY (EGD) WITH PROPOFOL N/A 10/30/2016   Procedure: ESOPHAGOGASTRODUODENOSCOPY (EGD) WITH PROPOFOL;  Surgeon: Robert Bellow, MD;  Location: [facility] ENDOSCOPY;  Service: Endoscopy;  Laterality: N/A;   ESOPHAGOGASTRODUODENOSCOPY (EGD) WITH PROPOFOL N/A 11/18/2018   Procedure: ESOPHAGOGASTRODUODENOSCOPY (EGD) WITH PROPOFOL;  Surgeon: Robert Bellow, MD;  Location: [facility] ENDOSCOPY;  Service: Endoscopy;  Laterality: N/A;   THYROIDECTOMY Right 2015   TUBAL LIGATION     UPPER GASTROINTESTINAL ENDOSCOPY N/A 2012   chronic grastitis, Barrett's esophagus   Social History   Socioeconomic History   Marital status: Married    Spouse name: Not on file   Number of children: 3   Years of education: Not on file   Highest education level: Associate degree: occupational, Hotel manager, or vocational program  Occupational History   Occupation: retired  Tobacco Use   Smoking status: Never   Smokeless tobacco: Never  Vaping Use   Vaping Use: Never used  Substance and Sexual Activity   Alcohol use: No   Drug use: No   Sexual activity: Not on file  Other Topics Concern   Not on file  Social History Narrative   Not on file   Social Determinants of [place]  Resource Strain: Not on file  Food Insecurity: Not on file  Transportation Needs: Not on file  Physical Activity: Not on file  Stress: Not on file  Social Connections: Not on file  Intimate Partner Violence: Not on file   Social History   Social History Narrative   Not on file     ROS: Negative.     PE: HEENT: Negative. Lungs: Clear. Cardio: RR.   Assessment/Plan:  Proceed with planned endoscopy.   Forest Gleason [facility] 12/15/2020

## 2020-12-15 NOTE — Transfer of Care (Signed)
Immediate Anesthesia Transfer of Care Note  Patient: [name]  Procedure(s) Performed: ESOPHAGOGASTRODUODENOSCOPY (EGD) WITH PROPOFOL  Patient Location: PACU  Anesthesia Type:General  Level of Consciousness: awake and alert   Airway & Oxygen Therapy: Patient Spontanous Breathing  Post-op Assessment: Report given to RN and Post -op Vital signs reviewed and stable  Post vital signs: Reviewed and stable  Last Vitals:  Vitals Value Taken Time  BP 127/75 12/15/20 0902  Temp    Pulse 66 12/15/20 0902  Resp 20 12/15/20 0902  SpO2 99 % 12/15/20 0902    Last Pain:  Vitals:   12/15/20 0754  TempSrc: Temporal  PainSc: 0-No pain         Complications: No notable events documented.

## 2020-12-15 NOTE — Op Note (Signed)
[facility] Gastroenterology Patient Name: Kathryn Brooks Procedure Date: 12/15/2020 8:35 AM MRN: [id number] Account #: [id number] Date of Birth: 10-Oct-1935 Admit Type: Outpatient Age: 85 Room: [facility] ENDO ROOM 1 Gender: Female Note Status: Finalized Procedure:             Upper GI endoscopy Indications:           Follow-up of Barrett's esophagus Providers:             Robert Bellow, MD Referring MD:          Mar Daring (Referring MD) Medicines:             Propofol per Anesthesia Complications:         No immediate complications. Procedure:             Pre-Anesthesia Assessment:                        - Prior to the procedure, a History and Physical was                         performed, and patient medications, allergies and                         sensitivities were reviewed. The patient's tolerance                         of previous anesthesia was reviewed.                        - The risks and benefits of the procedure and the                         sedation options and risks were discussed with the                         patient. All questions were answered and informed                         consent was obtained.                        After obtaining informed consent, the endoscope was                         passed under direct vision. Throughout the procedure,                         the patient's blood pressure, pulse, and oxygen                         saturations were monitored continuously. The Endoscope                         was introduced through the mouth, and advanced to the                         second part of duodenum. The upper GI endoscopy was  accomplished without difficulty. The patient tolerated                         the procedure well. Findings:      LA Grade D (one or more mucosal breaks involving at least 75% of       esophageal circumference) esophagitis with no bleeding was found 27 to        35 cm from the incisors. Mucosa was biopsied with a cold forceps for       histology in 3 sections at intervals of 2 cm in the middle third of the       esophagus and in the lower third of the esophagus and at the       gastroesophageal junction. A total of 3 specimen bottles were sent to       pathology. Biopsies werer completed at 35, 33 and 29 cm from the       incisors. The latter was the site of normal esophageal mucosa.      A medium-sized hiatal hernia was present.      The examined duodenum was normal. Impression:            - LA Grade D chronic esophagitis with no bleeding.                         Biopsied.                        - Medium-sized hiatal hernia.                        - Normal examined duodenum. Recommendation:        - Telephone endoscopist for pathology results in 1                         week. Procedure Code(s):     --- Professional ---                        [phone], Esophagogastroduodenoscopy, flexible,                         transoral; with biopsy, single or multiple Diagnosis Code(s):     --- Professional ---                        K20.90, Esophagitis, unspecified without bleeding                        K44.9, Diaphragmatic hernia without obstruction or                         gangrene                        K22.70, Barrett's esophagus without dysplasia CPT copyright 2019 American Medical Association. All rights reserved. The codes documented in this report are preliminary and upon coder review may  be revised to meet current compliance requirements. Robert Bellow, MD 12/15/2020 8:59:09 AM This report has been signed electronically. Number of Addenda: 0 Note Initiated On: 12/15/2020 8:35 AM Estimated Blood Loss:  Estimated blood loss: none. Estimated blood loss was  minimal. Estimated blood loss was minimal.      [facility]

## 2020-12-15 NOTE — Anesthesia Postprocedure Evaluation (Signed)
Anesthesia Post Note  Patient: Kathryn Brooks  Procedure(s) Performed: ESOPHAGOGASTRODUODENOSCOPY (EGD) WITH PROPOFOL  Patient location during evaluation: Endoscopy Anesthesia Type: General Level of consciousness: awake and alert Pain management: pain level controlled Vital Signs Assessment: post-procedure vital signs reviewed and stable Respiratory status: spontaneous breathing, nonlabored ventilation, respiratory function stable and patient connected to nasal cannula oxygen Cardiovascular status: blood pressure returned to baseline and stable Postop Assessment: no apparent nausea or vomiting Anesthetic complications: no   No notable events documented.   Last Vitals:  Vitals:   12/15/20 0910 12/15/20 0920  BP: (!) 161/70 (!) 160/79  Pulse: 60   Resp: 14   Temp:    SpO2: 98%     Last Pain:  Vitals:   12/15/20 0920  TempSrc:   PainSc: 0-No pain                 Arita Miss

## 2020-12-18 ENCOUNTER — Encounter: Payer: Self-pay | Admitting: General Surgery

## 2020-12-18 LAB — SURGICAL PATHOLOGY

## 2022-01-02 ENCOUNTER — Telehealth: Payer: Self-pay

## 2022-01-02 NOTE — Telephone Encounter (Signed)
Spoke with patient's daughter Marliss Coots and scheduled a telephonic Palliative Consult for 01/10/22 @ 2:30 PM.  Consent obtained; updated Netsmart, Team List and Epic.

## 2022-01-10 ENCOUNTER — Other Ambulatory Visit: Payer: BC Managed Care – PPO | Admitting: Primary Care

## 2022-01-10 DIAGNOSIS — Z515 Encounter for palliative care: Secondary | ICD-10-CM

## 2022-01-10 DIAGNOSIS — F0280 Dementia in other diseases classified elsewhere without behavioral disturbance: Secondary | ICD-10-CM

## 2022-01-10 DIAGNOSIS — R0602 Shortness of breath: Secondary | ICD-10-CM

## 2022-01-10 NOTE — Progress Notes (Signed)
    [facility]er[employer] [facility] Palliative Care Consult Note Telephone: [phone]  Fax: [phone]    Date of encounter: 01/10/22 PATIENT NAME: Kathryn Brooks [address] [place] [address]   [phone] (home)  DOB: [birth year] MRN: [id number] PRIMARY CARE PROVIDER:    Rubye Beach,  [address] [place] [address] [phone]  REFERRING PROVIDER:   Rubye Beach [address] [place],  [place] [address] [phone]  RESPONSIBLE PARTY:    Contact Information     Name Relation Home Work Mobile   Kathryn Brooks, Kathryn Brooks Onycha Daughter   [phone]       Due to the COVID-19 crisis, this visit was done via telemedicine from my office and it was initiated and consent by this patient and or family.  I connected with  Senoia,  PROXY,  on 01/10/22 by a audio enabled telemedicine application and verified that I am speaking with the correct person using two identifiers.   I discussed the limitations of evaluation and management by telemedicine. The patient expressed understanding and agreed to proceed.  Palliative Care was asked to follow this patient by consultation request of  Mar Daring, P* to address advance care planning.   ASSESSMENT AND PLAN / RECOMMENDATIONS:   Advance Care Planning/Goals of Care: Goals include to maximize quality of life and symptom management. Patient/health care surrogate gave his/her permission to discuss.Our advance care planning conversation included a discussion about:    The value and importance of advance care planning  Experiences with loved ones who have been seriously ill or have died - husband quite ill currently Exploration of personal, cultural or spiritual beliefs that might influence medical decisions  Exploration of goals of care in the event of a sudden injury or illness  Identification of a  healthcare agent - does not have clear but Marliss Coots  is serving Review of an  advance directive document- Does not have in place.  Not clear if she has capacity to make decisions from her answers to my interview.  CODE STATUS:FULL  T/c with daughter who is caregiver. Patient husband in hospital. Family is asking about care giving for patient and her husband. He has been septic and is ICU step down. Patient drives in town, and cares for his husband. Sister has been staying with patient and family needs someone to care for her as husband had been. Family appears concerned about her being by herself, and her mentation. Looking for care. Recommend SW from [facility] for care, guardianship, etc. Will call if desired. Discussed in -home care services, long term insurance. Patient husband has children, and Marliss Coots has other siblings.  No one has guardianship but they are working together. Daughter wants to wait  to schedule in home visit until things are clearer re  to make any decisions.  Follow up Palliative Care Visit: Palliative care will continue to follow for complex medical decision making, advance care planning, and clarification of goals. Return 4-6 weeks or prn.  I spent 30 minutes providing this consultation. More than 50% of the time in this consultation was spent in counseling and care coordination.  Thank you for the opportunity to participate in the care of Ms. Juba.  The palliative care team will continue to follow. Please call our office at [phone] if we can be of additional assistance.   Jason Coop DNP, MPH, AGPCNP-BC, [facility]

## 2022-12-27 ENCOUNTER — Encounter: Payer: Self-pay | Admitting: Physician Assistant

## 2023-02-28 ENCOUNTER — Ambulatory Visit (INDEPENDENT_AMBULATORY_CARE_PROVIDER_SITE_OTHER): Payer: Medicare Other | Admitting: Family Medicine

## 2023-02-28 ENCOUNTER — Encounter: Payer: Self-pay | Admitting: Family Medicine

## 2023-02-28 VITALS — BP 130/73 | HR 63 | Ht 69.0 in | Wt 163.2 lb

## 2023-02-28 DIAGNOSIS — E559 Vitamin D deficiency, unspecified: Secondary | ICD-10-CM

## 2023-02-28 DIAGNOSIS — Z23 Encounter for immunization: Secondary | ICD-10-CM

## 2023-02-28 DIAGNOSIS — E039 Hypothyroidism, unspecified: Secondary | ICD-10-CM | POA: Diagnosis not present

## 2023-02-28 DIAGNOSIS — R350 Frequency of micturition: Secondary | ICD-10-CM

## 2023-02-28 DIAGNOSIS — N3000 Acute cystitis without hematuria: Secondary | ICD-10-CM

## 2023-02-28 DIAGNOSIS — E7800 Pure hypercholesterolemia, unspecified: Secondary | ICD-10-CM | POA: Insufficient documentation

## 2023-02-28 DIAGNOSIS — E538 Deficiency of other specified B group vitamins: Secondary | ICD-10-CM

## 2023-02-28 DIAGNOSIS — F0280 Dementia in other diseases classified elsewhere without behavioral disturbance: Secondary | ICD-10-CM

## 2023-02-28 DIAGNOSIS — K219 Gastro-esophageal reflux disease without esophagitis: Secondary | ICD-10-CM

## 2023-02-28 DIAGNOSIS — G301 Alzheimer's disease with late onset: Secondary | ICD-10-CM

## 2023-02-28 DIAGNOSIS — Z79899 Other long term (current) drug therapy: Secondary | ICD-10-CM

## 2023-02-28 LAB — POCT URINALYSIS DIPSTICK
Bilirubin, UA: NEGATIVE
Blood, UA: NEGATIVE
Glucose, UA: NEGATIVE
Ketones, UA: NEGATIVE
Nitrite, UA: POSITIVE
Protein, UA: NEGATIVE
Spec Grav, UA: 1.015 (ref 1.010–1.025)
Urobilinogen, UA: 0.2 U/dL — AB
pH, UA: 6 (ref 5.0–8.0)

## 2023-02-28 MED ORDER — ESCITALOPRAM OXALATE 5 MG PO TABS: 5.0000 mg | ORAL_TABLET | Freq: Every day | ORAL | 3 refills | Status: AC

## 2023-02-28 MED ORDER — SULFAMETHOXAZOLE-TRIMETHOPRIM 800-160 MG PO TABS: 1.0000 | ORAL_TABLET | Freq: Two times a day (BID) | ORAL | 0 refills | Status: DC

## 2023-02-28 NOTE — Progress Notes (Signed)
New patient visit   Patient: Kathryn Brooks   DOB: [birth year]   87 y.o. Female  MRN: [id number] Visit Date: 02/28/2023  Today's healthcare provider: Jacky Kindle, Kathryn Brooks  Patient presents for new patient visit to establish care.  Introduced to Publishing rights manager role and practice setting.  All questions answered.  Discussed provider/patient relationship and expectations.  Pt presents with her daughter, Kathryn Brooks; she has another daughter, Kathryn Brooks, however, she lives out of state  Chief Complaint  Patient presents with   Establish Care    Dark urine, would like to check for UTI    Subjective    Kathryn Brooks is a 87 y.o. female who presents today as a new patient to establish care.  HPI HPI     Establish Care    Additional comments: Dark urine, would like to check for UTI       Last edited by Rolly Salter, CMA on 02/28/2023  1:49 PM.     Kathryn Brooks, a patient with a history of Alzheimer's disease, presents with recent behavioral changes characterized by outbursts. These changes have been noted by her home health CNA. Kathryn Brooks's daughter reports that Kathryn Brooks had been without her Alzheimer's medication for approximately a month, three months ago, due to the medication being hidden by her late husband. The patient's daughter also reports that Hidayah has been prescribed Seroquel for her outbursts, but expresses concern about potential side effects, particularly as [name] is living alone.  In addition to her Alzheimer's disease, Kathryn Brooks has a history of thyroid issues and is currently on levothyroxine. She has been without this medication for a similar duration as her Alzheimer's medication. The patient's daughter reports that Kathryn Brooks's urine has been dark recently, raising concerns about a potential urinary tract infection.  Past Medical History:  Diagnosis Date   Barrett's esophagus [id number]   biopsies were completed at 34,32,30,and 26 cm from the incisors. gastric cardia type mucosa  was identified from 30-34 cm. Squamocolumnar mucosa with reflux gastroesophagitis and Barrett's esophagus was noted at 26 cm. No atypia or dysplasia was identified. The previous biopsies of December 18, 2010 at 25,30,35 cm showed similar changes. There has been no progression of the Barrett's(long segment) and   Barrett's esophagus determined by biopsy 2012   No atypia on March 2014 biopsy.   Blood in stool [id number]   Breast screening, unspecified 2013   Dementia (HCC)    GERD (gastroesophageal reflux disease) [id number]   Obesity, unspecified 2013   Special screening for malignant neoplasms, colon 2013   Thyroid disease    Past Surgical History:  Procedure Laterality Date   COLONOSCOPY N/A 2005,2012   heme positive stools   COLONOSCOPY WITH PROPOFOL N/A 11/18/2018   Procedure: COLONOSCOPY WITH PROPOFOL;  Surgeon: Earline Mayotte, MD;  Location: [facility] ENDOSCOPY;  Service: Endoscopy;  Laterality: N/A;   ESOPHAGOGASTRODUODENOSCOPY N/A 09/28/2014   Procedure: ESOPHAGOGASTRODUODENOSCOPY (EGD);  Surgeon: Earline Mayotte, MD;  Location: [facility] ENDOSCOPY;  Service: Endoscopy;  Laterality: N/A;   ESOPHAGOGASTRODUODENOSCOPY (EGD) WITH PROPOFOL N/A 10/30/2016   Procedure: ESOPHAGOGASTRODUODENOSCOPY (EGD) WITH PROPOFOL;  Surgeon: Earline Mayotte, MD;  Location: [facility] ENDOSCOPY;  Service: Endoscopy;  Laterality: N/A;   ESOPHAGOGASTRODUODENOSCOPY (EGD) WITH PROPOFOL N/A 11/18/2018   Procedure: ESOPHAGOGASTRODUODENOSCOPY (EGD) WITH PROPOFOL;  Surgeon: Earline Mayotte, MD;  Location: [facility] ENDOSCOPY;  Service: Endoscopy;  Laterality: N/A;   ESOPHAGOGASTRODUODENOSCOPY (EGD) WITH PROPOFOL N/A 12/15/2020   Procedure: ESOPHAGOGASTRODUODENOSCOPY (EGD) WITH PROPOFOL;  Surgeon: Earline Mayotte, MD;  Location:  [facility] ENDOSCOPY;  Service: Endoscopy;  Laterality: N/A;   THYROIDECTOMY Right 2015   TUBAL LIGATION     UPPER GASTROINTESTINAL ENDOSCOPY N/A 2012   chronic grastitis, Barrett's esophagus   Family Status   Relation Name Status   Mother  Deceased       colon cancer   Father  Deceased at age 15       PNA   Sister  Alive   Brother  Deceased       MI   Daughter  Alive   Son  Alive   Daughter  Alive  No partnership data on file   Family History  Problem Relation Age of Onset   Colon cancer Mother        07/19/2004   Cancer Mother    Social History   Socioeconomic History   Marital status: Widowed    Spouse name: Not on file   Number of children: 3   Years of education: Not on file   Highest education level: Associate degree: academic program  Occupational History   Occupation: retired  Tobacco Use   Smoking status: Never   Smokeless tobacco: Never  Vaping Use   Vaping status: Never Used  Substance and Sexual Activity   Alcohol use: No   Drug use: No   Sexual activity: Not on file  Other Topics Concern   Not on file  Social History Narrative   Not on file   Social Determinants of Health   Financial Resource Strain: Low Risk  (02/26/2023)   Overall Financial Resource Strain (CARDIA)    Difficulty of Paying Living Expenses: Not very hard  Food Insecurity: No Food Insecurity (02/26/2023)   Hunger Vital Sign    Worried About Running Out of Food in the Last Year: Never true    Ran Out of Food in the Last Year: Never true  Transportation Needs: No Transportation Needs (02/26/2023)   PRAPARE - Administrator, Civil Service (Medical): No    Lack of Transportation (Non-Medical): No  Physical Activity: Unknown (02/26/2023)   Exercise Vital Sign    Days of Exercise per Week: 0 days    Minutes of Exercise per Session: Not on file  Stress: Patient Declined (02/26/2023)   [employer] of Occupational Health - Occupational Stress Questionnaire    Feeling of Stress : Patient declined  Social Connections: Moderately Integrated (02/26/2023)   Social Connection and Isolation Panel [NHANES]    Frequency of Communication with Friends and Family: More than three  times a week    Frequency of Social Gatherings with Friends and Family: Twice a week    Attends Religious Services: More than 4 times per year    Active Member of [employer] or Organizations: Yes    Attends Banker Meetings: More than 4 times per year    Marital Status: Widowed   Outpatient Medications Prior to Visit  Medication Sig   cholecalciferol (VITAMIN D) 1000 units tablet Take 2,000 Units by mouth 2 (two) times daily.    donepezil (ARICEPT) 10 MG tablet Take 10 mg by mouth at bedtime.   levothyroxine (SYNTHROID) 25 MCG tablet Take 1 tablet (25 mcg total) by mouth daily before breakfast.   memantine (NAMENDA) 10 MG tablet Take 10 mg by mouth 2 (two) times daily.   omeprazole (PRILOSEC) 40 MG capsule Take 1 capsule (40 mg total) by mouth daily. Acid reflux   QUEtiapine (SEROQUEL) 25 MG tablet Take 25 mg by mouth at bedtime.  vitamin B-12 (CYANOCOBALAMIN) 1000 MCG tablet Take 1,000 mcg by mouth in the morning and at bedtime.    [DISCONTINUED] aspirin 81 MG tablet Take 81 mg by mouth daily.   [DISCONTINUED] escitalopram (LEXAPRO) 5 MG tablet Take 5 mg by mouth daily.   aspirin EC 81 MG tablet Take 81 mg by mouth daily.   [DISCONTINUED] donepezil (ARICEPT) 10 MG tablet Take 10 mg by mouth at bedtime.    [DISCONTINUED] Memantine HCl (NAMENDA XR PO) Take 1 tablet by mouth 2 (two) times daily.    No facility-administered medications prior to visit.   No Known Allergies  Immunization History  Administered Date(s) Administered   Fluad Quad(high Dose 65+) 04/07/2019   Fluad Trivalent(High Dose 65+) 02/28/2023   Influenza, High Dose Seasonal PF 02/09/2015, 03/09/2018   Moderna Sars-Covid-2 Vaccination 07/08/2019, 08/05/2019   Pneumococcal Conjugate-13 08/01/2014   Pneumococcal Polysaccharide-23 11/30/2010   Td 06/21/2004    Health Maintenance  Topic Date Due   Zoster Vaccines- Shingrix (1 of 2) Never done   DTaP/Tdap/Td (2 - Tdap) 06/21/2014   Medicare Annual Wellness  (AWV)  03/10/2020   COVID-19 Vaccine (3 - 2023-24 season) 01/12/2023   Pneumonia Vaccine 3+ Years old  Completed   INFLUENZA VACCINE  Completed   DEXA SCAN  Completed   HPV VACCINES  Aged Out   Patient Care Team: Kathryn Kindle, Kathryn Brooks as PCP - General (Family Medicine) Lemar Livings, Merrily Pew, MD (General Surgery) Bud Face, MD as Referring Physician (Otolaryngology) Isla Pence, OD as Consulting Physician (Optometry) Eliezer Lofts, NP (Inactive) as Nurse Practitioner (Hospice and Palliative Medicine)   Objective    BP 130/73 (BP Location: Right Arm, Patient Position: Sitting, Cuff Size: Normal)   Pulse 63   Ht 5\' 9"  (1.753 m)   Wt 163 lb 3.2 oz (74 kg)   SpO2 98%   BMI 24.10 kg/m   Physical Exam Vitals and nursing note reviewed.  Constitutional:      General: She is not in acute distress.    Appearance: Normal appearance. She is normal weight. She is not ill-appearing, toxic-appearing or diaphoretic.  HENT:     Head: Normocephalic and atraumatic.  Cardiovascular:     Rate and Rhythm: Normal rate and regular rhythm.     Pulses: Normal pulses.     Heart sounds: Normal heart sounds. No murmur heard.    No friction rub. No gallop.  Pulmonary:     Effort: Pulmonary effort is normal. No respiratory distress.     Breath sounds: Normal breath sounds. No stridor. No wheezing, rhonchi or rales.  Chest:     Chest wall: No tenderness.  Musculoskeletal:        General: No swelling, tenderness, deformity or signs of injury. Normal range of motion.     Right lower leg: No edema.     Left lower leg: No edema.  Skin:    General: Skin is warm and dry.     Capillary Refill: Capillary refill takes less than 2 seconds.     Coloration: Skin is not jaundiced or pale.     Findings: No bruising, erythema, lesion or rash.  Neurological:     Mental Status: She is alert and oriented to person, place, and time.     Cranial Nerves: No cranial nerve deficit.     Sensory: No  sensory deficit.     Motor: No weakness.     Coordination: Coordination normal.  Psychiatric:  Mood and Affect: Mood normal.        Behavior: Behavior normal.        Thought Content: Thought content normal.        Cognition and Memory: Cognition is impaired. Memory is impaired. She exhibits impaired recent memory and impaired remote memory.        Judgment: Judgment normal.    Depression Screen    02/28/2023    2:39 PM 03/11/2019   10:58 AM 03/09/2018   10:55 AM 03/06/2017    2:05 PM  PHQ 2/9 Scores  PHQ - 2 Score 0 0 0 0  PHQ- 9 Score 1      Results for orders placed or performed in visit on 02/28/23  POCT Urinalysis Dipstick  Result Value Ref Range   Color, UA     Clarity, UA     Glucose, UA Negative Negative   Bilirubin, UA negative    Ketones, UA negative    Spec Grav, UA 1.015 1.010 - 1.025   Blood, UA negative    pH, UA 6.0 5.0 - 8.0   Protein, UA Negative Negative   Urobilinogen, UA 0.2 (A) 0.2 or 1.0 E.U./dL   Nitrite, UA positive    Leukocytes, UA Moderate (2+) (A) Negative   Appearance     Odor      Assessment & Plan     Alzheimer's Disease Reports of increased agitation and outbursts. Currently on Aricept (donzopril). Discussed restarting Lexapro (escitalopram) and initiating Seroquel (quetiapine) for agitation. -Restart Lexapro at 5mg  daily. -Initiate Seroquel at bedtime. -Monitor for any side effects.  Urinary Tract Infection Positive for nitrites and leukocytes on urinalysis. No reported symptoms of dysuria, urgency, or frequency. -Start antibiotic therapy. -Send urine for culture and sensitivity.  Hypothyroidism History of thyroidectomy and currently on levothyroxine. Last labs from May 2021 showed subclinical hypothyroidism. -Continue levothyroxine. -Check thyroid function tests.  Hyperlipidemia History of elevated cholesterol. Discussed the potential need for statin therapy based on current levels. -Check lipid panel.  General  Health Maintenance -Continue baby aspirin and vitamin D supplementation. -Check vitamin D and B12 levels. -Check complete blood count and basic metabolic panel. -Consider mammogram and bone density screening based on patient's preference and overall health status. -Follow-up in 6 weeks.  Return in about 6 weeks (around 04/11/2023) for anxiety and depression.    Leilani Merl, Kathryn Brooks, have reviewed all documentation for this visit. The documentation on 02/28/23 for the exam, diagnosis, procedures, and orders are all accurate and complete.  Kathryn Kindle, Kathryn Brooks  [facility] [phone] (phone) [phone] (fax)  [facility]

## 2023-03-01 LAB — CBC WITH DIFFERENTIAL/PLATELET
Basophils Absolute: 0.1 10*3/uL (ref 0.0–0.2)
Basos: 1 %
EOS (ABSOLUTE): 0.1 10*3/uL (ref 0.0–0.4)
Eos: 1 %
Hematocrit: 42 % (ref 34.0–46.6)
Hemoglobin: 13.9 g/dL (ref 11.1–15.9)
Immature Grans (Abs): 0 10*3/uL (ref 0.0–0.1)
Immature Granulocytes: 0 %
Lymphocytes Absolute: 1.4 10*3/uL (ref 0.7–3.1)
Lymphs: 19 %
MCH: 31.1 pg (ref 26.6–33.0)
MCHC: 33.1 g/dL (ref 31.5–35.7)
MCV: 94 fL (ref 79–97)
Monocytes Absolute: 0.5 10*3/uL (ref 0.1–0.9)
Monocytes: 7 %
Neutrophils Absolute: 5.2 10*3/uL (ref 1.4–7.0)
Neutrophils: 72 %
Platelets: 223 10*3/uL (ref 150–450)
RBC: 4.47 x10E6/uL (ref 3.77–5.28)
RDW: 12.3 % (ref 11.7–15.4)
WBC: 7.3 10*3/uL (ref 3.4–10.8)

## 2023-03-01 LAB — COMPREHENSIVE METABOLIC PANEL
ALT: 12 [IU]/L (ref 0–32)
AST: 18 [IU]/L (ref 0–40)
Albumin: 4.3 g/dL (ref 3.7–4.7)
Alkaline Phosphatase: 59 [IU]/L (ref 44–121)
BUN/Creatinine Ratio: 15 (ref 12–28)
BUN: 20 mg/dL (ref 8–27)
Bilirubin Total: 0.4 mg/dL (ref 0.0–1.2)
CO2: 24 mmol/L (ref 20–29)
Calcium: 9.6 mg/dL (ref 8.7–10.3)
Chloride: 105 mmol/L (ref 96–106)
Creatinine, Ser: 1.3 mg/dL — ABNORMAL HIGH (ref 0.57–1.00)
Globulin, Total: 2 g/dL (ref 1.5–4.5)
Glucose: 89 mg/dL (ref 70–99)
Potassium: 4.5 mmol/L (ref 3.5–5.2)
Sodium: 143 mmol/L (ref 134–144)
Total Protein: 6.3 g/dL (ref 6.0–8.5)
eGFR: 40 mL/min/{1.73_m2} — ABNORMAL LOW (ref >59)

## 2023-03-01 LAB — LIPID PANEL
Chol/HDL Ratio: 2.7 {ratio} (ref 0.0–4.4)
Cholesterol, Total: 195 mg/dL (ref 100–199)
HDL: 71 mg/dL (ref >39)
LDL Chol Calc (NIH): 107 mg/dL — ABNORMAL HIGH (ref 0–99)
Triglycerides: 94 mg/dL (ref 0–149)
VLDL Cholesterol Cal: 17 mg/dL (ref 5–40)

## 2023-03-01 LAB — TSH: TSH: 1.46 u[IU]/mL (ref 0.450–4.500)

## 2023-03-01 LAB — HEMOGLOBIN A1C
Est. average glucose Bld gHb Est-mCnc: 105 mg/dL
Hgb A1c MFr Bld: 5.3 % (ref 4.8–5.6)

## 2023-03-01 LAB — B12 AND FOLATE PANEL
Folate: 9.7 ng/mL (ref >3.0)
Vitamin B-12: 1955 pg/mL — ABNORMAL HIGH (ref 232–1245)

## 2023-03-01 LAB — VITAMIN D 25 HYDROXY (VIT D DEFICIENCY, FRACTURES): Vit D, 25-Hydroxy: 33 ng/mL (ref 30.0–100.0)

## 2023-03-01 NOTE — Progress Notes (Signed)
Chronic kidney disease noted; creatinine at 1.3 with eGFR at 40.  B-12 is over corrected; recommend stopping supplement. Cholesterol show elevated bad/LDL cholesterol.  TSH is stable. Vit D remains borderline low; continue 2000 IU per day. Recommend repeat BMP in 1 month following focus on hydration.

## 2023-03-04 LAB — URINE CULTURE

## 2023-03-04 LAB — SPECIMEN STATUS REPORT

## 2023-04-09 ENCOUNTER — Encounter: Payer: Self-pay | Admitting: Family Medicine

## 2023-04-09 ENCOUNTER — Ambulatory Visit (INDEPENDENT_AMBULATORY_CARE_PROVIDER_SITE_OTHER): Payer: Medicare Other | Admitting: Family Medicine

## 2023-04-09 VITALS — BP 129/65 | HR 62 | Ht 69.0 in | Wt 161.0 lb

## 2023-04-09 DIAGNOSIS — E538 Deficiency of other specified B group vitamins: Secondary | ICD-10-CM | POA: Diagnosis not present

## 2023-04-09 DIAGNOSIS — E890 Postprocedural hypothyroidism: Secondary | ICD-10-CM | POA: Diagnosis not present

## 2023-04-09 DIAGNOSIS — G301 Alzheimer's disease with late onset: Secondary | ICD-10-CM

## 2023-04-09 DIAGNOSIS — R7989 Other specified abnormal findings of blood chemistry: Secondary | ICD-10-CM | POA: Insufficient documentation

## 2023-04-09 DIAGNOSIS — F0280 Dementia in other diseases classified elsewhere without behavioral disturbance: Secondary | ICD-10-CM

## 2023-04-09 MED ORDER — LEVOTHYROXINE SODIUM 25 MCG PO TABS: 25.0000 ug | ORAL_TABLET | Freq: Every day | ORAL | 3 refills | Status: AC

## 2023-04-09 NOTE — Assessment & Plan Note (Signed)
Chronic, stable Trial of melatonin to assist with sleep concerns Has in home care givers; denies falls

## 2023-04-09 NOTE — Assessment & Plan Note (Signed)
Chronic, stable Repeat BMP Has been trying to drink more water, less soda/less tea BP stable.

## 2023-04-09 NOTE — Progress Notes (Signed)
Established patient visit   Patient: Kathryn Brooks   DOB: Nov 18, 1935   87 y.o. Female  MRN: [id number] Visit Date: 04/09/2023  Today's healthcare provider: Jacky Kindle, FNP  Re Introduced to nurse practitioner role and practice setting.  All questions answered.  Discussed provider/patient relationship and expectations.  Subjective    HPI   6 week f/u- pt notes to be increasing water intake and drinking less soda. Reports some anxiety between 2-5 pm when there is not a CMA/CNA at the house to assist. Reassurance has been provided to pt from family. Nightly agitation improved with use of OTC Melatonin at 5 mg at bedtime.   Medications: Outpatient Medications Prior to Visit  Medication Sig   aspirin EC 81 MG tablet Take 81 mg by mouth daily.   cholecalciferol (VITAMIN D) 1000 units tablet Take 2,000 Units by mouth 2 (two) times daily.    escitalopram (LEXAPRO) 5 MG tablet Take 1 tablet (5 mg total) by mouth daily.   melatonin 5 MG TABS Take 5 mg by mouth.   omeprazole (PRILOSEC) 40 MG capsule Take 1 capsule (40 mg total) by mouth daily. Acid reflux   [DISCONTINUED] levothyroxine (SYNTHROID) 25 MCG tablet Take 1 tablet (25 mcg total) by mouth daily before breakfast.   [DISCONTINUED] donepezil (ARICEPT) 10 MG tablet Take 10 mg by mouth at bedtime.   [DISCONTINUED] memantine (NAMENDA) 10 MG tablet Take 10 mg by mouth 2 (two) times daily.   [DISCONTINUED] QUEtiapine (SEROQUEL) 25 MG tablet Take 25 mg by mouth at bedtime.   [DISCONTINUED] sulfamethoxazole-trimethoprim (BACTRIM DS) 800-160 MG tablet Take 1 tablet by mouth 2 (two) times daily.   [DISCONTINUED] vitamin B-12 (CYANOCOBALAMIN) 1000 MCG tablet Take 1,000 mcg by mouth in the morning and at bedtime.    No facility-administered medications prior to visit.    Review of Systems Last CBC Lab Results  Component Value Date   WBC 7.3 02/28/2023   HGB 13.9 02/28/2023   HCT 42.0 02/28/2023   MCV 94 02/28/2023   MCH 31.1  02/28/2023   RDW 12.3 02/28/2023   PLT 223 02/28/2023   Last metabolic panel Lab Results  Component Value Date   GLUCOSE 89 02/28/2023   NA 143 02/28/2023   K 4.5 02/28/2023   CL 105 02/28/2023   CO2 24 02/28/2023   BUN 20 02/28/2023   CREATININE 1.30 (H) 02/28/2023   EGFR 40 (L) 02/28/2023   CALCIUM 9.6 02/28/2023   PROT 6.3 02/28/2023   ALBUMIN 4.3 02/28/2023   LABGLOB 2.0 02/28/2023   AGRATIO 1.5 04/01/2018   BILITOT 0.4 02/28/2023   ALKPHOS 59 02/28/2023   AST 18 02/28/2023   ALT 12 02/28/2023   ANIONGAP 8 06/05/2016   Last lipids Lab Results  Component Value Date   CHOL 195 02/28/2023   HDL 71 02/28/2023   LDLCALC 107 (H) 02/28/2023   TRIG 94 02/28/2023   CHOLHDL 2.7 02/28/2023   Last hemoglobin A1c Lab Results  Component Value Date   HGBA1C 5.3 02/28/2023   Last thyroid functions Lab Results  Component Value Date   TSH 1.460 02/28/2023   T4TOTAL 8.9 10/07/2019   Last vitamin D Lab Results  Component Value Date   VD25OH 33.0 02/28/2023   Last vitamin B12 and Folate Lab Results  Component Value Date   VITAMINB12 1,955 (H) 02/28/2023   FOLATE 9.7 02/28/2023     Objective    BP 129/65   Pulse 62   Ht 5'  9" (1.753 m)   Wt 161 lb (73 kg)   SpO2 98%   BMI 23.78 kg/m   BP Readings from Last 3 Encounters:  04/09/23 129/65  02/28/23 130/73  12/15/20 (!) 160/79   Wt Readings from Last 3 Encounters:  04/09/23 161 lb (73 kg)  02/28/23 163 lb 3.2 oz (74 kg)  12/15/20 175 lb (79.4 kg)   Physical Exam Vitals and nursing note reviewed.  Constitutional:      General: She is not in acute distress.    Appearance: Normal appearance. She is normal weight. She is not ill-appearing, toxic-appearing or diaphoretic.  HENT:     Head: Normocephalic and atraumatic.  Cardiovascular:     Rate and Rhythm: Normal rate and regular rhythm.     Pulses: Normal pulses.     Heart sounds: Normal heart sounds. No murmur heard.    No friction rub. No gallop.   Pulmonary:     Effort: Pulmonary effort is normal. No respiratory distress.     Breath sounds: Normal breath sounds. No stridor. No wheezing, rhonchi or rales.  Chest:     Chest wall: No tenderness.  Musculoskeletal:        General: No swelling, tenderness, deformity or signs of injury. Normal range of motion.     Right lower leg: No edema.     Left lower leg: No edema.  Skin:    General: Skin is warm and dry.     Capillary Refill: Capillary refill takes less than 2 seconds.     Coloration: Skin is not jaundiced or pale.     Findings: No bruising, erythema, lesion or rash.  Neurological:     General: No focal deficit present.     Mental Status: She is alert and oriented to person, place, and time. Mental status is at baseline.     Cranial Nerves: No cranial nerve deficit.     Sensory: No sensory deficit.     Motor: No weakness.     Coordination: Coordination normal.  Psychiatric:        Mood and Affect: Mood normal.        Behavior: Behavior normal.        Thought Content: Thought content normal.        Judgment: Judgment normal.     No results found for any visits on 04/09/23.  Assessment & Plan     Problem List Items Addressed This Visit       Endocrine   Hypothyroid    Chronic, stable Repeat TSH completed 6 weeks ago Continue synthroid at 25 mcg daily      Relevant Medications   levothyroxine (SYNTHROID) 25 MCG tablet     Nervous and Auditory   Late onset Alzheimer's disease without behavioral disturbance (HCC)    Chronic, stable Trial of melatonin to assist with sleep concerns Has in home care givers; denies falls        Other   Disorder of vitamin B12    Previously over corrected; repeat labs       Relevant Orders   B12 and Folate Panel   Elevated serum creatinine - Primary    Chronic, stable Repeat BMP Has been trying to drink more water, less soda/less tea BP stable.      Relevant Orders   Basic Metabolic Panel (BMET)   Return if symptoms  worsen or fail to improve.     Leilani Merl, FNP, have reviewed all documentation for this visit. The  documentation on 04/09/23 for the exam, diagnosis, procedures, and orders are all accurate and complete.  Jacky Kindle, FNP  [facility] [phone] (phone) [phone] (fax)  [facility]

## 2023-04-09 NOTE — Assessment & Plan Note (Signed)
Chronic, stable Repeat TSH completed 6 weeks ago Continue synthroid at 25 mcg daily

## 2023-04-09 NOTE — Assessment & Plan Note (Signed)
Previously over corrected; repeat labs

## 2023-04-10 LAB — BASIC METABOLIC PANEL
BUN/Creatinine Ratio: 19 (ref 12–28)
BUN: 19 mg/dL (ref 8–27)
CO2: 25 mmol/L (ref 20–29)
Calcium: 9.3 mg/dL (ref 8.7–10.3)
Chloride: 104 mmol/L (ref 96–106)
Creatinine, Ser: 1.01 mg/dL — ABNORMAL HIGH (ref 0.57–1.00)
Glucose: 91 mg/dL (ref 70–99)
Potassium: 4.6 mmol/L (ref 3.5–5.2)
Sodium: 144 mmol/L (ref 134–144)
eGFR: 55 mL/min/{1.73_m2} — ABNORMAL LOW (ref >59)

## 2023-04-10 LAB — B12 AND FOLATE PANEL
Folate: 7.2 ng/mL (ref >3.0)
Vitamin B-12: 733 pg/mL (ref 232–1245)

## 2023-04-14 NOTE — Progress Notes (Signed)
Kidney function has greatly improved! Keep up hydration efforts. OK to resume low dose Vit B-12 or B complex OTC daily if desired.

## 2023-07-18 ENCOUNTER — Ambulatory Visit: Payer: Self-pay | Admitting: Physician Assistant

## 2023-07-18 ENCOUNTER — Encounter: Payer: Self-pay | Admitting: Physician Assistant

## 2023-07-18 VITALS — BP 136/75 | Resp 16 | Ht 69.0 in | Wt 162.0 lb

## 2023-07-18 DIAGNOSIS — R5383 Other fatigue: Secondary | ICD-10-CM

## 2023-07-18 DIAGNOSIS — E7800 Pure hypercholesterolemia, unspecified: Secondary | ICD-10-CM

## 2023-07-18 DIAGNOSIS — E039 Hypothyroidism, unspecified: Secondary | ICD-10-CM

## 2023-07-18 DIAGNOSIS — R350 Frequency of micturition: Secondary | ICD-10-CM | POA: Diagnosis not present

## 2023-07-18 DIAGNOSIS — R7989 Other specified abnormal findings of blood chemistry: Secondary | ICD-10-CM

## 2023-07-18 DIAGNOSIS — G301 Alzheimer's disease with late onset: Secondary | ICD-10-CM

## 2023-07-18 DIAGNOSIS — F0280 Dementia in other diseases classified elsewhere without behavioral disturbance: Secondary | ICD-10-CM

## 2023-07-18 DIAGNOSIS — E559 Vitamin D deficiency, unspecified: Secondary | ICD-10-CM

## 2023-07-18 DIAGNOSIS — Z602 Problems related to living alone: Secondary | ICD-10-CM

## 2023-07-18 LAB — POCT URINALYSIS DIPSTICK
Bilirubin, UA: NEGATIVE
Blood, UA: NEGATIVE
Glucose, UA: NEGATIVE
Ketones, UA: NEGATIVE
Leukocytes, UA: NEGATIVE
Nitrite, UA: NEGATIVE
Protein, UA: NEGATIVE
Spec Grav, UA: 1.02 (ref 1.010–1.025)
Urobilinogen, UA: 0.2 U/dL
pH, UA: 6 (ref 5.0–8.0)

## 2023-07-19 ENCOUNTER — Encounter: Payer: Self-pay | Admitting: Physician Assistant

## 2023-07-19 DIAGNOSIS — R5383 Other fatigue: Secondary | ICD-10-CM | POA: Insufficient documentation

## 2023-07-19 DIAGNOSIS — Z602 Problems related to living alone: Secondary | ICD-10-CM | POA: Insufficient documentation

## 2023-07-19 NOTE — Progress Notes (Signed)
 Established patient visit  Patient: Kathryn Brooks   DOB: [birth year]   88 y.o. Female  MRN: [id number] Visit Date: 07/18/2023  Today's healthcare provider: Debera Lat, PA-C   Chief Complaint  Patient presents with   Establish Care    Pt fell recently and needs to est care with a PCP   Subjective     Discussed the use of AI scribe software for clinical note transcription with the patient, who gave verbal consent to proceed.  History of Present Illness   The patient, a 88 year old with a history of dementia and hypothyroidism, presents with increased confusion and hallucinations. The patient's daughter, who is her primary caregiver, suspects these symptoms may be due to a urinary tract infection (UTI). The patient has been experiencing frequent urination. The patient also has a history of a fall in December, which resulted in a fracture in her neck. She is currently undergoing physical therapy to improve her balance. The patient is on levothyroxine for hypothyroidism and es citalopram, which was recently prescribed by her neurologist behavior issues with dementia.           07/18/2023    3:25 PM 02/28/2023    2:39 PM 03/11/2019   10:58 AM  Depression screen PHQ 2/9  Decreased Interest 0 0 0  Down, Depressed, Hopeless 0 0 0  PHQ - 2 Score 0 0 0  Altered sleeping 0 0   Tired, decreased energy 1 1   Change in appetite 0 0   Feeling bad or failure about yourself  0 0   Trouble concentrating 0 0   Moving slowly or fidgety/restless 0 0   Suicidal thoughts 0 0   PHQ-9 Score 1 1   Difficult doing work/chores Not difficult at all        07/18/2023    3:26 PM  GAD 7 : Generalized Anxiety Score  Nervous, Anxious, on Edge 0  Control/stop worrying 0  Worry too much - different things 0  Trouble relaxing 0  Restless 0  Easily annoyed or irritable 1  Afraid - awful might happen 1  Total GAD 7 Score 2  Anxiety Difficulty Somewhat difficult    Medications: Outpatient  Medications Prior to Visit  Medication Sig   aspirin EC 81 MG tablet Take 81 mg by mouth daily.   cholecalciferol (VITAMIN D) 1000 units tablet Take 2,000 Units by mouth 2 (two) times daily.    escitalopram (LEXAPRO) 5 MG tablet Take 1 tablet (5 mg total) by mouth daily.   levothyroxine (SYNTHROID) 25 MCG tablet Take 1 tablet (25 mcg total) by mouth daily before breakfast.   melatonin 5 MG TABS Take 5 mg by mouth. (Patient not taking: Reported on 07/18/2023)   omeprazole (PRILOSEC) 40 MG capsule Take 1 capsule (40 mg total) by mouth daily. Acid reflux   No facility-administered medications prior to visit.    Review of Systems All negative Except see HPI       Objective    BP 136/75 (BP Location: Left Arm, Patient Position: Sitting)   Resp 16   Ht 5\' 9"  (1.753 m)   Wt 162 lb (73.5 kg)   BMI 23.92 kg/m     Physical Exam Vitals reviewed.  Constitutional:      General: She is not in acute distress.    Appearance: Normal appearance. She is well-developed. She is not diaphoretic.  HENT:     Head: Normocephalic and atraumatic.  Eyes:     General: No scleral icterus.  Conjunctiva/sclera: Conjunctivae normal.  Neck:     Thyroid: No thyromegaly.  Cardiovascular:     Rate and Rhythm: Normal rate and regular rhythm.     Pulses: Normal pulses.     Heart sounds: Normal heart sounds. No murmur heard. Pulmonary:     Effort: Pulmonary effort is normal. No respiratory distress.     Breath sounds: Normal breath sounds. No wheezing, rhonchi or rales.  Musculoskeletal:     Cervical back: Neck supple.     Right lower leg: No edema.     Left lower leg: No edema.  Lymphadenopathy:     Cervical: No cervical adenopathy.  Skin:    General: Skin is warm and dry.     Findings: No rash.  Neurological:     Mental Status: She is alert and oriented to person, place, and time. Mental status is at baseline.  Psychiatric:        Mood and Affect: Mood normal.        Behavior: Behavior normal.       Results for orders placed or performed in visit on 07/18/23  POCT urinalysis dipstick  Result Value Ref Range   Color, UA yellow    Clarity, UA clear    Glucose, UA Negative Negative   Bilirubin, UA negative    Ketones, UA negative    Spec Grav, UA 1.020 1.010 - 1.025   Blood, UA negative    pH, UA 6.0 5.0 - 8.0   Protein, UA Negative Negative   Urobilinogen, UA 0.2 0.2 or 1.0 E.U./dL   Nitrite, UA negative    Leukocytes, UA Negative Negative   Appearance     Odor          Assessment and Plan    Urinary Tract Infection (UTI) Recurrent Symptoms suggestive of UTI, including confusion and hallucinations. Awaiting urinalysis results to confirm diagnosis. - Obtain urine sample for urinalysis and culture. - Await urine culture results before prescribing antibiotics.  Dementia/moderate Alzheimer's dementia Dementia with hallucinations and confusion. On quetiapine 25mg , requiring monitoring for safety and efficacy. - Coordinate with neurology for ongoing management. Per chart review, patient was off of dementia medications since 11/2022  Will monitor  Hypothyroidism chronic and previously stable Hypothyroidism managed with levothyroxine. Thyroid function tests were normal in November. - Order TSH and thyroid hormone levels. - Provide script for fasting blood work. will fu  General Health Maintenance 88 years old, requires regular health maintenance. Due for annual wellness exam. - Schedule annual wellness exam in one month. - Perform comprehensive blood work including vitamin D levels.     Lives alone with help available Patient is currently living alone but has good support system with CNA's and video monitoring  Acquired hypothyroidism  - TSH - AMB Referral [facility] Care Management  Late onset Alzheimer's disease without behavioral disturbance (HCC) - AMB Referral [facility] Care Management  Frequent urination POCT [place] dipstick and urine culture  Other  fatigue Chronic Initial workup - Hemoglobin A1c - Lipid panel - Comprehensive metabolic panel - CBC with Differential/Platelet - TSH - VITAMIN D 25 Hydroxy (Vit-D Deficiency, Fractures) - Urine Culture - POCT urinalysis dipstick Will reassess after receiving lab results  Lives alone with help available - AMB Referral [facility] Care Management  Elevated LDL cholesterol level -lp  Elevated serum creatinine -cmp  Vitamin D deficiency In the past -vit D level  Orders Placed This Encounter  Procedures   Urine Culture   Hemoglobin A1c   Lipid panel  Has the patient fasted?:   Yes   Comprehensive metabolic panel    Has the patient fasted?:   Yes   CBC with Differential/Platelet   TSH   VITAMIN D 25 Hydroxy (Vit-D Deficiency, Fractures)   POCT urinalysis dipstick    No follow-ups on file.   The patient was advised to call back or seek an in-person evaluation if the symptoms worsen or if the condition fails to improve as anticipated.  I discussed the assessment and treatment plan with the patient. The patient was provided an opportunity to ask questions and all were answered. The patient agreed with the plan and demonstrated an understanding of the instructions.  I, Debera Lat, PA-C have reviewed all documentation for this visit. The documentation on 07/18/2023  for the exam, diagnosis, procedures, and orders are all accurate and complete.  Debera Lat, [facility], MMS [facility] [phone] (phone) [phone] (fax)  [facility]

## 2023-07-22 ENCOUNTER — Telehealth: Payer: Self-pay

## 2023-07-22 NOTE — Progress Notes (Signed)
 Care Guide Pharmacy Note  07/22/2023 Name: Kathryn Brooks MRN: [id number] DOB: March 14, 1936  Referred By: Debera Lat, PA-C Reason for referral: Care Coordination (Outreach to schedule with Pharm d )   Kathryn Brooks is a 88 y.o. year old female who is a primary care patient of Debera Lat, [place].  Kathryn Brooks was referred to the pharmacist for assistance related to: Dementia  Successful contact was made with the patient to discuss pharmacy services including being ready for the pharmacist to call at least 5 minutes before the scheduled appointment time and to have medication bottles and any blood pressure readings ready for review. The patient agreed to meet with the pharmacist via telephone visit on (date/time).08/05/2023  Penne Lash , RMA     [place]  [facility], [facility] Guide  Direct Dial: [phone]  Website: [email or web address]

## 2023-07-22 NOTE — Progress Notes (Signed)
 Care Guide Pharmacy Note  07/22/2023 Name: Kathryn Brooks MRN: [id number] DOB: June 12, 1935  Referred By: Debera Lat, PA-C Reason for referral: Care Coordination (Outreach to schedule with Pharm d )   Kathryn Brooks is a 88 y.o. year old female who is a primary care patient of Debera Lat, [place].  Kathryn Brooks was referred to the pharmacist for assistance related to: Dementia  An unsuccessful telephone outreach was attempted today to contact the patient who was referred to the pharmacy team for assistance with medication management. Additional attempts will be made to contact the patient.  Kathryn Brooks , RMA     [facility]  [facility], [facility] Guide  Direct Dial: [phone]  Website: [email or web address]

## 2023-07-23 LAB — SPECIMEN STATUS REPORT

## 2023-07-23 LAB — URINE CULTURE

## 2023-07-24 LAB — CBC WITH DIFFERENTIAL/PLATELET
Basophils Absolute: 0.1 10*3/uL (ref 0.0–0.2)
Basos: 1 %
EOS (ABSOLUTE): 0.2 10*3/uL (ref 0.0–0.4)
Eos: 3 %
Hematocrit: 41.1 % (ref 34.0–46.6)
Hemoglobin: 13.8 g/dL (ref 11.1–15.9)
Immature Grans (Abs): 0 10*3/uL (ref 0.0–0.1)
Immature Granulocytes: 0 %
Lymphocytes Absolute: 1.3 10*3/uL (ref 0.7–3.1)
Lymphs: 25 %
MCH: 32 pg (ref 26.6–33.0)
MCHC: 33.6 g/dL (ref 31.5–35.7)
MCV: 95 fL (ref 79–97)
Monocytes Absolute: 0.4 10*3/uL (ref 0.1–0.9)
Monocytes: 8 %
Neutrophils Absolute: 3.3 10*3/uL (ref 1.4–7.0)
Neutrophils: 63 %
Platelets: 225 10*3/uL (ref 150–450)
RBC: 4.31 x10E6/uL (ref 3.77–5.28)
RDW: 12.3 % (ref 11.7–15.4)
WBC: 5.2 10*3/uL (ref 3.4–10.8)

## 2023-07-24 LAB — COMPREHENSIVE METABOLIC PANEL
ALT: 15 IU/L (ref 0–32)
AST: 19 IU/L (ref 0–40)
Albumin: 4.1 g/dL (ref 3.7–4.7)
Alkaline Phosphatase: 61 IU/L (ref 44–121)
BUN/Creatinine Ratio: 19 (ref 12–28)
BUN: 22 mg/dL (ref 8–27)
Bilirubin Total: 0.4 mg/dL (ref 0.0–1.2)
CO2: 26 mmol/L (ref 20–29)
Calcium: 9.4 mg/dL (ref 8.7–10.3)
Chloride: 106 mmol/L (ref 96–106)
Creatinine, Ser: 1.18 mg/dL — ABNORMAL HIGH (ref 0.57–1.00)
Globulin, Total: 2 g/dL (ref 1.5–4.5)
Glucose: 75 mg/dL (ref 70–99)
Potassium: 4.5 mmol/L (ref 3.5–5.2)
Sodium: 144 mmol/L (ref 134–144)
Total Protein: 6.1 g/dL (ref 6.0–8.5)
eGFR: 45 mL/min/{1.73_m2} — ABNORMAL LOW (ref >59)

## 2023-07-24 LAB — TSH: TSH: 1.15 u[IU]/mL (ref 0.450–4.500)

## 2023-07-24 LAB — LIPID PANEL
Chol/HDL Ratio: 2.9 ratio (ref 0.0–4.4)
Cholesterol, Total: 207 mg/dL — ABNORMAL HIGH (ref 100–199)
HDL: 72 mg/dL (ref >39)
LDL Chol Calc (NIH): 121 mg/dL — ABNORMAL HIGH (ref 0–99)
Triglycerides: 78 mg/dL (ref 0–149)
VLDL Cholesterol Cal: 14 mg/dL (ref 5–40)

## 2023-07-24 LAB — VITAMIN D 25 HYDROXY (VIT D DEFICIENCY, FRACTURES): Vit D, 25-Hydroxy: 36.5 ng/mL (ref 30.0–100.0)

## 2023-07-24 LAB — HEMOGLOBIN A1C
Est. average glucose Bld gHb Est-mCnc: 103 mg/dL
Hgb A1c MFr Bld: 5.2 % (ref 4.8–5.6)

## 2023-07-25 ENCOUNTER — Encounter: Payer: Self-pay | Admitting: Physician Assistant

## 2023-07-25 NOTE — Progress Notes (Signed)
 Please let Kathryn Brooks/Kathryn Brooks know that lab results are available.

## 2023-08-05 ENCOUNTER — Other Ambulatory Visit: Admitting: Pharmacist

## 2023-08-05 ENCOUNTER — Other Ambulatory Visit: Payer: Self-pay | Admitting: Pharmacist

## 2023-08-05 NOTE — Progress Notes (Signed)
   08/05/2023  Patient ID: Kathryn Brooks, female   DOB: 11-18-1935, 88 y.o.   MRN: [id number]  Called and spoke with the patient's daughter Kathryn Brooks on the phone today. Medication review completed as daughter organizes her medications and provides them to her each day. Due to Alzheimer's patient will take them twice if left in front of her.  There are concerns regarding visual hallucinations that are getting worse. Patient reports seeing her late parents and husband, which are not as concerning. But is also wanting to go "home" even though she is already at home and having "men come in the house at night", which is most concerning. Showing fear to sleep at house alone, which they have a dog to assist with concerns.  Escitalopram 5mg  restarted on 05/31/23 (after last neurology visit) Quetiapine 25mg  started on 07/15/23  Believes Quetiapine has helped her sleep longer since starting.   For timeline of hallucinations starting, believes they have occurred greater than 1 month now, but have progressively been getting worse. Do not know which medication is more related as a potential cause. Started Seroquel 3 days before office visit noting hallucinations. However, had visit made on 1/28, so hallucination concerns were likely already starting then.   Plan: - Decrease Escitalopram to 2.5mg  for 7 days, then stop completely - Monitor for frequency and severity of hallucinations - Follow-up with Dr. Mercy Moore on 08/19/23    Kathryn Brooks, PharmD [place]  Phone Number: [phone]

## 2023-08-19 ENCOUNTER — Encounter: Payer: Self-pay | Admitting: Physician Assistant

## 2023-08-19 ENCOUNTER — Ambulatory Visit: Admitting: Physician Assistant

## 2023-08-19 VITALS — BP 128/49 | HR 55 | Resp 16 | Ht 69.0 in | Wt 165.9 lb

## 2023-08-19 DIAGNOSIS — M7989 Other specified soft tissue disorders: Secondary | ICD-10-CM | POA: Diagnosis not present

## 2023-08-19 DIAGNOSIS — N3942 Incontinence without sensory awareness: Secondary | ICD-10-CM | POA: Diagnosis not present

## 2023-08-19 DIAGNOSIS — F339 Major depressive disorder, recurrent, unspecified: Secondary | ICD-10-CM

## 2023-08-19 DIAGNOSIS — F0280 Dementia in other diseases classified elsewhere without behavioral disturbance: Secondary | ICD-10-CM

## 2023-08-19 DIAGNOSIS — G301 Alzheimer's disease with late onset: Secondary | ICD-10-CM

## 2023-08-19 DIAGNOSIS — L989 Disorder of the skin and subcutaneous tissue, unspecified: Secondary | ICD-10-CM | POA: Diagnosis not present

## 2023-08-19 DIAGNOSIS — R443 Hallucinations, unspecified: Secondary | ICD-10-CM

## 2023-08-19 NOTE — Progress Notes (Unsigned)
 Established patient visit  Patient: Kathryn Brooks   DOB: April 05, 1936   88 y.o. Female  MRN: [id number] Visit Date: 08/19/2023  Today's healthcare provider: Debera Lat, PA-C   Chief Complaint  Patient presents with   Medical Management of Chronic Issues    1 month follow-up   Subjective     HPI     Medical Management of Chronic Issues    Additional comments: 1 month follow-up      Last edited by Marjie Skiff, CMA on 08/19/2023  1:06 PM.       Discussed the use of AI scribe software for clinical note transcription with the patient, who gave verbal consent to proceed.  History of Present Illness The patient, a known case of Alzheimer's disease, presents with her daughter for a follow-up visit. The primary concern is the patient's hallucinations and changes in behavior. The patient has been seeing people who are not there during her waking hours, causing her distress. She also expresses fear and has had episodes of crying. The hallucinations have been occurring for a few months and were initially thought to be related to bedtime, leading to the prescription of Seroquel.  In addition to the hallucinations, the patient has been experiencing urinary incontinence. She has been using Depends due to her inability to remember to go to the bathroom. The patient has a history of a urinary tract infection (UTI), but no current symptoms of a UTI are reported.  The patient's depression has reportedly increased, and she has been on Lexapro for about three months. However, the Lexapro was recently stopped, and the patient's hallucinations have reportedly improved since then. The patient's caregiver has been managing her symptoms by calling her at night to calm her down and help her sleep.  The patient also has two bumps on her face, which have been present for a while. The nature of these bumps is unclear.       08/19/2023    1:11 PM 07/18/2023    3:25 PM 02/28/2023    2:39 PM   Depression screen PHQ 2/9  Decreased Interest 2 0 0  Down, Depressed, Hopeless 1 0 0  PHQ - 2 Score 3 0 0  Altered sleeping 0 0 0  Tired, decreased energy 1 1 1   Change in appetite 0 0 0  Feeling bad or failure about yourself  1 0 0  Trouble concentrating 3 0 0  Moving slowly or fidgety/restless 0 0 0  Suicidal thoughts 0 0 0  PHQ-9 Score 8 1 1   Difficult doing work/chores Somewhat difficult Not difficult at all       08/19/2023    1:12 PM 07/18/2023    3:26 PM  GAD 7 : Generalized Anxiety Score  Nervous, Anxious, on Edge 1 0  Control/stop worrying 0 0  Worry too much - different things 0 0  Trouble relaxing 0 0  Restless 0 0  Easily annoyed or irritable 2 1  Afraid - awful might happen 0 1  Total GAD 7 Score 3 2  Anxiety Difficulty Somewhat difficult Somewhat difficult    Medications: Outpatient Medications Prior to Visit  Medication Sig   aspirin EC 81 MG tablet Take 81 mg by mouth daily.   cholecalciferol (VITAMIN D) 1000 units tablet Take 2,000 Units by mouth 2 (two) times daily.    levothyroxine (SYNTHROID) 25 MCG tablet Take 1 tablet (25 mcg total) by mouth daily before breakfast.   omeprazole (PRILOSEC) 40 MG capsule Take 1  capsule (40 mg total) by mouth daily. Acid reflux (Patient taking differently: Take 20 mg by mouth daily. Acid reflux)   QUEtiapine (SEROQUEL) 25 MG tablet Take 1 tablet by mouth at bedtime.   escitalopram (LEXAPRO) 5 MG tablet Take 1 tablet (5 mg total) by mouth daily. (Patient not taking: Reported on 08/19/2023)   No facility-administered medications prior to visit.    Review of Systems All negative Except see HPI   {Insert previous labs (optional):23779} {See past labs  Heme  Chem  Endocrine  Serology  Results Review (optional):1}   Objective    BP (!) 128/49 (BP Location: Left Arm, Patient Position: Sitting, Cuff Size: Large)   Pulse (!) 55   Resp 16   Ht 5\' 9"  (1.753 m)   Wt 165 lb 14.4 oz (75.3 kg)   SpO2 99%   BMI 24.50 kg/m   {Insert last BP/Wt (optional):23777}{See vitals history (optional):1}   Physical Exam   No results found for any visits on 08/19/23.      Assessment and Plan Assessment & Plan Urinary Incontinence Experiences urgency and stress incontinence without UTI. Referral for further evaluation and potential medication management recommended. - Refer to urology or urogynecology for evaluation and management. - Discuss potential medication options and side effects with the specialist.  Alzheimer's Disease with Behavioral Disturbances Alzheimer's with hallucinations and depression. Hallucinations may be linked to quetiapine and Lexapro. Lexapro reduction improved symptoms, but concerns remain. Behavioral health intervention suggested for medication adjustment. - Refer to behavioral health for evaluation and management of depression and hallucinations. - Continue neurology follow-up for Alzheimer's management. - Consider alternative medications such as trazodone for sleep disturbances.  Facial Lesions Two facial lesions suspected benign. Dermatological evaluation recommended as a precaution. - Refer to dermatology for evaluation of facial lesions.  Leg Swelling Mild leg swelling managed with compression stockings. - Recommend wearing mild compression stockings.    No orders of the defined types were placed in this encounter.   No follow-ups on file.   The patient was advised to call back or seek an in-person evaluation if the symptoms worsen or if the condition fails to improve as anticipated.  I discussed the assessment and treatment plan with the patient. The patient was provided an opportunity to ask questions and all were answered. The patient agreed with the plan and demonstrated an understanding of the instructions.  I, Debera Lat, PA-C have reviewed all documentation for this visit. The documentation on 08/19/2023  for the exam, diagnosis, procedures, and orders are all accurate  and complete.  Debera Lat, [facility], MMS [facility] [phone] (phone) [phone] (fax)  [facility]

## 2023-08-22 NOTE — Progress Notes (Signed)
 Ref Provider: [facility], [facility]*  PCP: Ostwalt, Janna, PA  Assessment and Plan:   In most patients we give written parts of assessment and plan to patient under Patient Instructions/After Visit Summary. So some parts are directed to patient.  Dear Ms. Kathryn Brooks, It was our pleasure to participate in your care. We have typed up brief summary of what we discussed. Assessment & Plan Cervical Degenerative Disc Disease Advanced degenerative changes at C4-C6, with C3-C4 causing spinal cord compression, resulting in neck pain, weakness, imbalance, numbness, and tingling. Completed six weeks of physical therapy focusing on neck and back exercises to prevent falls. Surgery is not recommended due to age and risk factors, with a conservative approach prioritized to reduce fall risk. Notes and official images unavailable for my review.  - Continue exercise - Not a surgical candidate per report   Risk of Falls Unwitnessed fall on December 25th resulted in a neck injury. Balance issues and cervical spine degeneration increase fall risk. Exercises continue to strengthen core and improve balance. Home safety measures are implemented. - Continue daily exercises with CNAs to improve strength and balance - Ensure home environment is safe to prevent falls  Moderate Alzheimer's Disease with behavioral disturbances (Agitation, anxiousness, frustration, stressful family situation) - primary caregiver husband passed away on 12-23-22. Patient is currently living alone - has good support system with CNAs and video monitoring for frequent checkup Off of dementia medications since then (family can't find all prescription medications - hoarding issue) - reports improvement since UTI has been treated   FMLA Paperwork for [employer] (Daughter and care taker) - Expires 06/2023 Notify us  in advance if needed for re-evaluation of renewal  Visual Hallucinations and Sundowning - likely 2/2 dementia   Experiences visual hallucinations and sundowning behavior, managed effectively with Seroquel. Psychiatry referral discussed but deferred due to current symptom management. Referral considered beneficial for future access if symptoms progress, given long wait times. - Continue Seroquel for symptom management - Consider psychiatry referral if symptoms worsen - CT Head (Brain) without contrast   Urinary Tract Infection Risk Increased UTI risk due to wearing Depends, leading to potential cross-contamination. Previous UTI concern with inconclusive tests. Monitoring symptoms preferred over further specialty referrals. - Ensure proper hygiene to reduce risk of infection  Care Coordination and Support Additional care support needed due to financial constraints and wandering behavior. Current care includes CNAs in the morning and evening, with concern for night support. Social work and care coordination services considered. [facility] and local support groups are potential resources. - Refer to social work for care coordination - Contact [facility] for additional support and resources - Explore local support groups and resources for caregivers  Follow-up Follow-up in 3-4 months unless condition changes. New internist referral planned due to communication issues with current primary care provider. Emphasis on care coordination to ensure all providers are on the same system. - Schedule follow-up appointment in 3-4 months - Refer to internal medicine for a new primary care provider - Ensure all care providers are on the same system for better coordination  Return in about 4 months (around 12/27/2023).  Interim History date 08/27/2023   Kathryn Brooks is a 88 y.o. female here for treatment and evaluation of Moderate Alzheimer's Disease without behavioral disturbances, accompanied by loved one.    Kathryn Brooks last visit was on 04/24/2023  History of Present Illness Kathryn Brooks is an 88 year old female  with advanced degenerative disc disease who presents with neck pain and hallucinations.  She has advanced degenerative disc disease, particularly affecting the cervical spine at C3-C4, causing neck pain, weakness, imbalance, numbness, and tingling. She experienced an unwitnessed fall on Christmas Day, leading to imaging studies including an MRI and x-rays. The MRI revealed a fracture and degenerative changes exerting pressure on the spinal cord. She completed six weeks of physical therapy focusing on her neck and back to prevent future falls and continues exercises at home with the assistance of her CNAs.  For the past three months, she has been experiencing visual hallucinations, seeing people in her house who are not there, including an old man who frightens her. These episodes sometimes prompt her to leave the house, which is concerning for her caregiver. She is currently on Seroquel, which seems to be managing her symptoms for now.  She wears Depends, raising concerns about urinary tract infections due to potential cross-contamination of urine samples. There was a previous suspicion of a UTI, but subsequent tests were normal.  She has two bothersome skin lesions and there is concern about skin picking. She is interested in seeing a dermatologist for these issues.  Her caregivers have noted sundowning behavior, where she packs her bags and wants to go home, which is managed by taking her for a drive. She has CNAs assisting her from 9 AM to 1 PM and 4 PM to 8 PM, but there is concern about needing additional care at night due to financial constraints.    Interim History date 04/24/2023   Kathryn Brooks is a 88 y.o. female here for treatment and evaluation of Moderate Alzheimer's Disease without behavioral disturbances, accompanied by loved one.    Kathryn Brooks last visit was on 12/23/2022  Per loved one, patient started the Melatonin during the night to help calm her down. Family has rearranged the in  home CNA schedule to have someone accommodate her during the night. Loved one expressed that the Lexapro  has been helpful in keeping patient calm as well. Per loved one, patients memory has been roughly the same since August of 2024, no significant changes other than forgetting certain events that has taken place during the day. There has been some swings in mood and behavior, occurring more quickly but they set sail as soon as they arrive. Patient appetite has been good and family has taken her off soft drinks.   Disease Summary:     Kathryn Brooks is a right handed 88 y.o. female  here for evaluation of Moderate Alzheimer's Disease without behavioral disturbances  Moderate Alzheimer's Disease without behavioral disturbances - recent exacerbation due to husband being in hospital, but resolved some with his return home- primary caregiver husband passed away on 12-26-22: Patient sates that memory issues began around August 2016. More issues with short term memory. Patient says she does not remember things. Her husband may tell her he is going to luch with some friends she will not remember. She will ask the same questions over again. Patient's husband needs to repeat things to her. Patient states her husband does most of the daily activities at their house. Patient is doing the laundry and clothes folding still.  The symptoms came on gradually. Patient has no history of head trauma or family history of dementia. Patient does not have history of  ADD or ADHD. Patient does not snore. Patient does not have hearing impairment. Patient does not drink significant amount of alcohol. Patient is not going through any major life event.  Patient says deficits in memory do not hamper  her daily functioning. Patient has no wandering episodes, no gait impairment, no hallucinations, no personality changes. Patient has good control of bladder and bowel. Patient  has good sense of smell. Of note, patient forgot her own birthday in  December 27, 2019.   Loved one notes changes in lifestyle/health, noting she has been sleeping more, worsened imbalance (needs additional support while walking), periods of shortness of breath (when walking up/down hill or for long distances), and changes to eating habits. Patient feels her sleep is ok, as she goes to sleep at night and wakes without sleep disturbances. However, loved one has noted she sleeps a lot during the day.   Loved one notes patient has been eating smaller portions and has noticed a decrease in her weight. Additionally, her clothes are not fitting the same as before. Per patient, she eats when she wants to eat.   On palliative care.   Patient's husband passed away 2022-12-28, he was her primary care taker and managed her medications. After he passed, patient was no longer receiving medication as family had a hard time sorting through Kathryn Brooks's things due to hoarding.   SLUMS 07/2015: 22/30  SLUMS 02/18/2019: 09/30 MRI Brain 08/09/2015: Mild cerebral atrophy.   KATZ 10/04/2020: 6/6 [place] IADLs 10/04/2020: 3/8  BEHAV5 10/04/2020: No to all questions PHQ-2 10/04/2020: 2/6 Safety Assessment checklist & End-of-life checklist in note from 10/04/2020 visit. Patient is not driving, history of falls, no safety concerns, POA in place.  Medications: Aricept , Namenda    Behavioral issues with dementia: Agitation, anxiousness, frustration, stressful family situation.  Isolated syncopal episode: She had a witnessed syncopal event at a store in December 19th 2020. She did not have any sicknesses. Previously ordered EEG, not scheduled.   Vitamin B12 deficiency and Vitamin D  deficiency: Taking oral supplements.   Patient's husband died December 28, 2022, who was managing Ms. Sebald's medication. Family states there is some hoarding and has not been able to find many of her medications. Since then, Kathryn Brooks has not been taking any of her prescription medications (patient has not taken donepezil , Namenda ,  or Lexapro  since about 2022/12/28)  Advanced Care Planing- DNR was ordered   Advance Care Planning  Discussed advanced care planning including healthcare power of attorney, financial power of attorney, living will, and code status.   Discussed and filled out DNR (Do Not Resuscitate) form for Kathryn Brooks during visit on 12/23/2022.   In the event of cardiac and/or pulmonary arrest of the patient, efforts at cardiopulmonary resuscitation of the patient SHOULD NOT be initiated. This order does not affect other medically indicated comfort care.    I have documented the basis for this order (moderate to advanced dementia) and consent from Kathryn Brooks (self) and Kathryn Brooks. This order has been entered into patient's electronic health record as of 12/23/22.  Physical Exam   Vitals Vitals:   08/27/23 1348  BP: 108/58  Pulse: 56  SpO2: 98%  Weight: 74.4 kg (164 lb)  Height: 172.7 cm (5' 8)       Body mass index is 24.94 kg/m.  (Some of the exam changes noted are from previous clinical observations)  General Exam Upper denture lower partial Bilateral myosis   Neurological Exam Patient remembered what she ate this morning, toast.  Decreased hearing bilaterally  Mild generalized age appropriate loss of muscle mass but strength seems 5/5, no focal atrophy, fasciculations seen.    Patient has decreased light touch, vibration and temperature sensation in lower extremities Patients gait was slow and  unsteady. Slight hunched forward posture Stands with the use of her hand Romberg negative   06/05/2021: Patient forgot how she celebrated her birthday  01/22/2022: Day - incorrect, Month - incorrect (February), Year - incorrect (41)   Address - correct (did not know zip code)  Phone Number - unsure    Number of children - correct  Number of grandchildren - unsure   Names of grandchildren - incorrect     Medications: Current Outpatient Medications on File Prior to Visit   Medication Sig Dispense Refill  . aspirin 81 MG EC tablet Take by mouth    . cholecalciferol (VITAMIN D3) 1000 unit tablet Take 1,000 Units by mouth once daily    . levothyroxine  (SYNTHROID ) 75 MCG tablet Take 1 tablet (75 mcg total) by mouth every morning before breakfast (0630) 30 tablet 6  . omeprazole  (PRILOSEC) 40 MG DR capsule Take 40 mg by mouth once daily    . QUEtiapine (SEROQUEL) 25 MG tablet Take 1 tablet (25 mg total) by mouth at bedtime 30 tablet 3  . cyanocobalamin (VITAMIN B12) 1000 MCG tablet Take 1,000 mcg by mouth once daily (Patient not taking: Reported on 08/27/2023)    . escitalopram  oxalate (LEXAPRO ) 5 MG tablet Take 1 tablet (5 mg total) by mouth every morning for 90 days (Patient not taking: Reported on 08/27/2023) 90 tablet 1  . melatonin-pyridoxine, vit B6, (MELATONIN, WITH B6,) 5-1 mg Tab Take 5 mg by mouth at bedtime (Patient not taking: Reported on 08/27/2023)     No current facility-administered medications on file prior to visit.   Past Medical History:  Past Medical History:  Diagnosis Date  . Barrett esophagus   . Dementia (CMS/HHS-HCC)   . GERD (gastroesophageal reflux disease)   . Thyroid  disease     Past Surgical History:  Past Surgical History:  Procedure Laterality Date  . EGD  2012  . THYROIDECTOMY TOTAL Right 2015  . COLONOSCOPY  11/18/2018   Moderate active colitis throughout the colon on random biopsy.  Atypical cells noted in the transverse colon without dysplasia or malignancy.  . EGD  11/18/2018   Ulceration and fibropurulent disease consistent with reflux at 25 cm, intestinal metaplasia 27 cm without increased eosinophils, intestinal metaplasia at 30 cm.  Metaplasia noted at 35 cm.  . EGD  12/15/2020   Barrett's Esophagus/Repeat 75yrs if general health is stable/[facility]  . COLONOSCOPY     2005, 2012  . LAPAROSCOPIC TUBAL LIGATION    . OTHER SURGERY     thyroid    Family History:  Family History  Problem Relation Name Age of Onset  .  Cancer Mother    . Colon cancer Mother    . Stroke Maternal Grandmother     Social History:  Social History   Socioeconomic History  . Marital status: Married  Tobacco Use  . Smoking status: Never  . Smokeless tobacco: Never  Vaping Use  . Vaping status: Never Used  Substance and Sexual Activity  . Alcohol use: No  . Drug use: No  . Sexual activity: Not Currently  Social History Narrative   Education: Automotive engineer   Occupation: retired   Hobbies: na   Marital Status:    Social Drivers of Corporate investment banker Strain: Medium Risk (07/17/2023)   Received from [facility]   Overall Financial Resource Strain (CARDIA)   . Difficulty of Paying Living Expenses: Somewhat hard  Food Insecurity: No Food Insecurity (07/17/2023)   Received from [facility]  Hunger Vital Sign   . Worried About Programme researcher, broadcasting/film/video in the Last Year: Never true   . Ran Out of Food in the Last Year: Never true  Transportation Needs: No Transportation Needs (07/17/2023)   Received from [facility] - Transportation   . Lack of Transportation (Medical): No   . Lack of Transportation (Non-Medical): No  Physical Activity: Unknown (07/17/2023)   Received from [facility]   Exercise Vital Sign   . Days of Exercise per Week: 0 days  Stress: No Stress Concern Present (07/17/2023)   Received from [facility] - Occupational Stress Questionnaire   . Feeling of Stress : Not at all  Social Connections: Moderately Integrated (07/17/2023)   Received from [facility]   Social Connection and Isolation Panel [NHANES]   . Frequency of Communication with Friends and Family: Three times a week   . Frequency of Social Gatherings with Friends and Family: Once a week   . Attends Religious Services: More than 4 times per year   . Active Member of Clubs or Organizations: No   . Attends Banker Meetings: More than 4 times per year   . Marital Status: Widowed   Housing Stability: Unknown (08/27/2023)   Housing Stability Vital Sign   . Homeless in the Last Year: No   Allergies: No Known Allergies   This note has been created using automated tools and reviewed for accuracy by KAITLIN CAFFARO.  Parts of this note were created with use of Dragon dictation services. Please note any typographical errors are unintentional. I apologize for any typographical errors that were not detected and corrected.   Return in about 4 months (around 12/27/2023). The patient will contact us  earlier if severity of neurologic symptoms increases or changes. This may or may not, depending on description of symptoms, necessitate an earlier appointment.    Payor: [employer] MEDICARE ADVANTAGE PLANS / Plan: [employer] PPO CHOICE / Product Type: PPO /    Attestation Statement:   I personally performed the service, non-incident to. (WP)   KAITLIN CAFFARO, PA     Kaitlin Paich, PA-C Board Certified by [facility] - Neurology  A [facility]

## 2023-09-02 ENCOUNTER — Other Ambulatory Visit: Payer: Self-pay

## 2023-09-02 DIAGNOSIS — G301 Alzheimer's disease with late onset: Secondary | ICD-10-CM

## 2023-09-23 ENCOUNTER — Ambulatory Visit
Admission: RE | Admit: 2023-09-23 | Discharge: 2023-09-23 | Disposition: A | Discharge status: Home / Self Care | Source: Ambulatory Visit

## 2023-09-23 DIAGNOSIS — G301 Alzheimer's disease with late onset: Secondary | ICD-10-CM | POA: Diagnosis present

## 2023-09-23 DIAGNOSIS — F02B18 Dementia in other diseases classified elsewhere, moderate, with other behavioral disturbance: Secondary | ICD-10-CM | POA: Insufficient documentation

## 2023-11-18 ENCOUNTER — Ambulatory Visit: Admitting: Physician Assistant

## 2023-11-18 NOTE — Progress Notes (Signed)
 Ref Provider: [facility], [facility]*  PCP: Harvey Gaetana Helling, NP  Assessment and Plan:   In most patients we give written parts of assessment and plan to patient under Patient Instructions/After Visit Summary. So some parts are directed to patient.  Dear Kathryn Brooks, It was our pleasure to participate in your care. We have typed up brief summary of what we discussed. Assessment & Plan Cervical Degenerative Disc Disease Advanced degenerative changes at C4-C6, with C3-C4 causing spinal cord compression, resulting in neck pain, weakness, imbalance, numbness, and tingling. Completed six weeks of physical therapy focusing on neck and back exercises to prevent falls. Surgery is not recommended due to age and risk factors, with a conservative approach prioritized to reduce fall risk. Notes and official images unavailable for my review.  - Continue exercise - Not a surgical candidate per report   Risk of Falls Unwitnessed fall on December 25th resulted in a neck injury. Balance issues and cervical spine degeneration increase fall risk. Exercises continue to strengthen core and improve balance. Home safety measures are implemented. Fell again 10/27/2023. Unwitnessed. Increased risk of falls due to Alzheimer's disease progression and possible episodes of hypoglycemia or hypotension. Recent fall in June with bruising on knees but no head injury. Full body shaking episodes may contribute to instability and falls. - Monitor blood sugar and blood pressure during shaking episodes. - Consider home health services for physical therapy to improve strength and balance. - Install cameras inside and consider outside for monitoring and safety. - Continue daily exercises with CNAs to improve strength and balance - Ensure home environment is safe to prevent falls - We will order a home health consult with physical therapy, occupational therapy, speech therapy, nursing, social work.  We send patients to  [facility]. They will verify your insurance information and reach out to schedule an appointment. If you need to reach them, you can also contact them directly using the information below: Information for new patients Elray Collum): [phone] Scheduling for current patients ([facility] office): [phone]  Moderate Alzheimer's Disease with behavioral disturbances (Agitation, anxiousness, frustration, stressful family situation) - primary caregiver husband passed away on 21-Dec-2022. Patient is currently living alone - has good support system with CNAs and video monitoring for frequent checkup  Progression of Alzheimer's disease with worsening memory loss and behavioral disturbances, including sundowning, worsening short-term memory, difficulty with task (e.g., opening car door). Reports of packing clothes and preparing to leave home during sundowning episodes. Full body shaking episodes possibly related to hypoglycemia or hypotension. Concerns about ability to perform daily activities and need for increased care. - Advancing.  - Coordinate with social worker and [facility] for increased access to care and financial assistance - Provide behavioral techniques for managing sundowning in MyChart. Provided  - Consider home health services for physical therapy, occupational therapy, and nursing to monitor blood pressure and blood sugar. - Prescribe glucometer for blood sugar monitoring. Can also find these at local drug store - whichever is more cost-effective is okay  - Discuss with social worker about potential transition to assisted or memory care if home care needs cannot be met. List of places provided  - Ideally should have 24/7 care. Increased level of care with hired CNA is cost-prohibitive. Will set up social work. Encouraged to consider advancing level of care to assisted or memory care if cannot increase care at home. List provided.  - Off of dementia medications since then (family can't  find all prescription medications - hoarding  issue) - reports improvement since UTI has been treated  - FMLA Paperwork for Kathryn Brooks (Daughter and care taker) - Expires 06/2023. Notify us  in advance if needed for re-evaluation of renewal  Visual Hallucinations and Sundowning - likely 2/2 dementia  Experiences visual hallucinations and sundowning behavior, managed effectively with Seroquel. Psychiatry referral discussed but deferred due to current symptom management. Referral considered beneficial for future access if symptoms progress, given long wait times. - Continue Seroquel for symptom management - Consider psychiatry referral if symptoms worsen - CT Head (Brain) without contrast was appropriate for age. Reviewed with patient and daughter.  - Discussed behavioral strategies for sundowning   Care Coordination and Support Additional care support needed due to financial constraints and wandering behavior. Current care includes CNAs in the morning and evening, with concern for night support. Social work and care coordination services considered. [facility] and local support groups are potential resources. - Refer to social work for care coordination - Contact [facility] for additional support and resources - Support permanent handicap placard. Will mail this to them.  - Explore local support groups and resources for caregivers  Results RADIOLOGY Head CT: Cerebral atrophy, small vessel sclerosis, no hemorrhage, neoplasm, or cerebrovascular accident (09/23/2023)  4 - 6 months, Kathryn Stallion, NP  Return in about 5 months (around 04/19/2024) for Memory, With Kathryn Stallion, NP.   Interim History date 11/18/2023   Kathryn Brooks is a 88 y.o. female here for treatment and evaluation of Memory Loss  Kathryn Brooks last visit was on 08/27/2023    Patient's loved one reports that memory has been worse. Patient now notes patient is forgetting how to do routine things such as how to get out of a  car. Hallucinations have been better per loved one. She has been packing her stuff up and trying to leave and the loved one does report patient has wandered out of the house to the neighbors house. Patient's loved one reports an unwitnessed fall on 10/27/2023 at home. Significant bruising to the lower extremity. No signs of head injury. Suspected she fell forward. Fall was unwitnessed. Struggling with sundowning. Has been having episodes of shaking when daughter arrives. Describes as full body shaking, instability. Was attempting to ambulate to the kitchen. Improved after eating cantaloupe.   Reviewed 09/23/2023 CT Head without: 1. No acute intracranial abnormality. 2. Stable mild atrophy and white matter changes.  History of Present Illness  Kathryn Brooks is an 88 year old female with neurodegeneration and vascular changes who presents with concerns about falls and memory issues. She is accompanied by her daughter, who is her primary caregiver.  She experienced a fall on October 27, 2023, resulting in bruising from her knee downwards. She does not recall the mechanism of the fall, and it is suspected to be similar to a previous fall where she fell forward onto her knees. No head injury was noted after the fall.  A CT scan conducted on Sep 23, 2023, showed brain atrophy and hardening of small blood vessels, consistent with age-related changes. There were no signs of bleeding, tumor, or stroke.  She experiences episodes of full-body shaking, which her daughter suspects may be related to low blood sugar. These episodes have occurred when she is alone, and she becomes unstable while trying to walk. Her daughter has managed these episodes by providing cantaloupe, which seems to help. There is concern that these episodes may contribute to her risk of falling.  She has been experiencing worsening sundowning symptoms, where she  packs her clothes and wants to 'go home.' Her daughter has observed her being shaky and  unstable, particularly when trying to walk, which may be related to her blood sugar levels. She has also had difficulty with simple tasks, such as opening car doors, and has expressed that her left hand 'doesn't work anymore.'  She has a history of physical therapy, but her daughter notes that she only exercises when the CNA is present. There is a concern about her ability to walk long distances, as she becomes weak and shaky after short walks, such as from a parking lot to a store.      Disease Summary:     Kathryn Brooks is a right handed 88 y.o. female  here for evaluation of Memory Loss  Moderate Alzheimer's Disease without behavioral disturbances - recent exacerbation due to husband being in hospital, but resolved some with his return home- primary caregiver husband passed away on 18-Dec-2022: Patient sates that memory issues began around August 2016. More issues with short term memory. Patient says she does not remember things. Her husband may tell her he is going to luch with some friends she will not remember. She will ask the same questions over again. Patient's husband needs to repeat things to her. Patient states her husband does most of the daily activities at their house. Patient is doing the laundry and clothes folding still.  The symptoms came on gradually. Patient has no history of head trauma or family history of dementia. Patient does not have history of  ADD or ADHD. Patient does not snore. Patient does not have hearing impairment. Patient does not drink significant amount of alcohol. Patient is not going through any major life event.  Patient says deficits in memory do not hamper her daily functioning. Patient has no wandering episodes, no gait impairment, no hallucinations, no personality changes. Patient has good control of bladder and bowel. Patient  has good sense of smell. Of note, patient forgot her own birthday in 2019-12-17.   Loved one notes changes in lifestyle/health, noting she has  been sleeping more, worsened imbalance (needs additional support while walking), periods of shortness of breath (when walking up/down hill or for long distances), and changes to eating habits. Patient feels her sleep is ok, as she goes to sleep at night and wakes without sleep disturbances. However, loved one has noted she sleeps a lot during the day.   Loved one notes patient has been eating smaller portions and has noticed a decrease in her weight. Additionally, her clothes are not fitting the same as before. Per patient, she eats when she wants to eat.   On palliative care.   Patient's husband passed away 18-Dec-2022, he was her primary care taker and managed her medications. After he passed, patient was no longer receiving medication as family had a hard time sorting through Kathryn Brooks's things due to hoarding.   SLUMS 07/2015: 22/30  SLUMS 02/18/2019: 09/30 MRI Brain 08/09/2015: Mild cerebral atrophy.   KATZ 10/04/2020: 6/6 [place] IADLs 10/04/2020: 3/8  BEHAV5 10/04/2020: No to all questions PHQ-2 10/04/2020: 2/6 Safety Assessment checklist & End-of-life checklist in note from 10/04/2020 visit. Patient is not driving, history of falls, no safety concerns, POA in place.  Medications: Aricept , Namenda    Behavioral issues with dementia: Agitation, anxiousness, frustration, stressful family situation.  Isolated syncopal episode: She had a witnessed syncopal event at a store in December 19th 2020. She did not have any sicknesses. Previously ordered EEG, not scheduled.  Vitamin B12 deficiency and Vitamin D  deficiency: Taking oral supplements.   Patient's husband died December 16, 2022, who was managing Kathryn Brooks's medication. Family states there is some hoarding and has not been able to find many of her medications. Since then, Kathryn Brooks has not been taking any of her prescription medications (patient has not taken donepezil , Namenda , or Lexapro  since about 2022-12-16)  Advanced Care Planing- DNR was  ordered   Advance Care Planning  Discussed advanced care planning including healthcare power of attorney, financial power of attorney, living will, and code status.   Discussed and filled out DNR (Do Not Resuscitate) form for Kathryn Brooks during visit on 12/23/2022.   In the event of cardiac and/or pulmonary arrest of the patient, efforts at cardiopulmonary resuscitation of the patient SHOULD NOT be initiated. This order does not affect other medically indicated comfort care.    I have documented the basis for this order (moderate to advanced dementia) and consent from Kathryn Brooks (self) and Kathryn Brooks. This order has been entered into patient's electronic health record as of 12/23/22.  Physical Exam   Vitals Vitals:   11/18/23 1308  PainSc: 0-No pain     There is no height or weight on file to calculate BMI.  Constitutional: alert, interactive with provider, cooperative, in no distress Mental status: oriented x 3, good historian, appropriate mood and behavior, thought content appears normal Respiratory: no respiratory distress, no audible wheezing  Able to open close hands no objective weakness in hands per caregiver   (Some of the exam changes noted are from previous clinical observations)  General Exam Upper denture lower partial Bilateral myosis   Neurological Exam Patient remembered what she ate this morning, toast.  Decreased hearing bilaterally  Mild generalized age appropriate loss of muscle mass but strength seems 5/5, no focal atrophy, fasciculations seen.    Patient has decreased light touch, vibration and temperature sensation in lower extremities Patients gait was slow and unsteady. Slight hunched forward posture Stands with the use of her hand Romberg negative   06/05/2021: Patient forgot how she celebrated her birthday  01/22/2022: Day - incorrect, Month - incorrect (February), Year - incorrect 2035-12-15)   Address - correct (did not know zip code)   Phone Number - unsure    Number of children - correct  Number of grandchildren - unsure   Names of grandchildren - incorrect     Medications: Current Outpatient Medications on File Prior to Visit  Medication Sig Dispense Refill  . aspirin 81 MG EC tablet Take by mouth    . cholecalciferol (VITAMIN D3) 1000 unit tablet Take 1,000 Units by mouth once daily    . escitalopram  oxalate (LEXAPRO ) 5 MG tablet Take 1 tablet (5 mg total) by mouth every morning for 90 days 90 tablet 1  . levothyroxine  (SYNTHROID ) 75 MCG tablet Take 1 tablet (75 mcg total) by mouth every morning before breakfast (0630) 30 tablet 6  . omeprazole  (PRILOSEC) 40 MG DR capsule Take 40 mg by mouth once daily    . QUEtiapine (SEROQUEL) 25 MG tablet Take 1 tablet (25 mg total) by mouth at bedtime 30 tablet 3  . melatonin-pyridoxine, vit B6, (MELATONIN, WITH B6,) 5-1 mg Tab Take 5 mg by mouth at bedtime (Patient not taking: Reported on 11/18/2023)     No current facility-administered medications on file prior to visit.   Past Medical History:  Past Medical History:  Diagnosis Date  . Barrett esophagus   . Dementia (CMS/HHS-HCC)   .  GERD (gastroesophageal reflux disease)   . Thyroid  disease     Past Surgical History:  Past Surgical History:  Procedure Laterality Date  . EGD  2012  . THYROIDECTOMY TOTAL Right 2015  . COLONOSCOPY  11/18/2018   Moderate active colitis throughout the colon on random biopsy.  Atypical cells noted in the transverse colon without dysplasia or malignancy.  . EGD  11/18/2018   Ulceration and fibropurulent disease consistent with reflux at 25 cm, intestinal metaplasia 27 cm without increased eosinophils, intestinal metaplasia at 30 cm.  Metaplasia noted at 35 cm.  . EGD  12/15/2020   Barrett's Esophagus/Repeat 44yrs if general health is stable/[facility]  . COLONOSCOPY     2005, 2012  . LAPAROSCOPIC TUBAL LIGATION    . OTHER SURGERY     thyroid    Family History:  Family History  Problem  Relation Name Age of Onset  . Cancer Mother    . Colon cancer Mother    . Stroke Maternal Grandmother     Social History:  Social History   Socioeconomic History  . Marital status: Widowed  Tobacco Use  . Smoking status: Never  . Smokeless tobacco: Never  Vaping Use  . Vaping status: Never Used  Substance and Sexual Activity  . Alcohol use: No  . Drug use: No  . Sexual activity: Not Currently  Social History Narrative   Education: Automotive engineer   Occupation: retired   Hobbies: na   Marital Status:    Social Drivers of Corporate investment banker Strain: Low Risk  (10/13/2023)   Overall Financial Resource Strain (CARDIA)   . Difficulty of Paying Living Expenses: Not hard at all  Recent Concern: Financial Resource Strain - Medium Risk (07/17/2023)   Received from [facility]   Overall Financial Resource Strain (CARDIA)   . Difficulty of Paying Living Expenses: Somewhat hard  Food Insecurity: No Food Insecurity (10/13/2023)   Hunger Vital Sign   . Worried About Programme researcher, broadcasting/film/video in the Last Year: Never true   . Ran Out of Food in the Last Year: Never true  Transportation Needs: No Transportation Needs (10/13/2023)   PRAPARE - Transportation   . Lack of Transportation (Medical): No   . Lack of Transportation (Non-Medical): No  Physical Activity: Unknown (07/17/2023)   [facility]   Exercise Vital Sign   . On average, how many days per week do you engage in moderate to strenuous exercise (like a brisk walk)?: 0 days  Stress: No Stress Concern Present (07/17/2023)   Received from [facility] - Occupational Stress Questionnaire   . Feeling of Stress : Not at all  Social Connections: Moderately Integrated (07/17/2023)   Received from [facility]   Social Connection and Isolation Panel   . In a typical week, how many times do you talk on the phone with family, friends, or neighbors?: Three times a week   . How often do you get  together with friends or relatives?: Once a week   . How often do you attend church or religious services?: More than 4 times per year   . Do you belong to any clubs or organizations such as church groups, unions, fraternal or athletic groups, or school groups?: No   . How often do you attend meetings of the clubs or organizations you belong to?: More than 4 times per year   . Are you married, widowed, divorced, separated, never married, or  living with a partner?: Widowed  Housing Stability: Low Risk  (10/13/2023)   Housing Stability Vital Sign   . Unable to Pay for Housing in the Last Year: No   . Number of Times Moved in the Last Year: 0   . Homeless in the Last Year: No   Allergies: No Known Allergies  This note has been created using automated tools and reviewed for accuracy by KAITLIN CAFFARO.  Parts of this note were created with a combination of Dragon dictation and automated scribing services. Please note any typographical errors are unintentional. I apologize for any typographical errors that were not detected and corrected.   Return in about 5 months (around 04/19/2024) for Memory, With Kathryn Stallion, NP. The patient will contact us  earlier if severity of neurologic symptoms increases or changes. This may or may not, depending on description of symptoms, necessitate an earlier appointment.    Payor: [employer] MEDICARE ADVANTAGE PLANS / Plan: [employer] PPO CHOICE / Product Type: PPO /    Attestation Statement:   I personally performed the service, non-incident to. (WP)   KAITLIN CAFFARO, PA   Kaitlin Caffaro, PA-C Board Certified by [facility] - Neurology  A [facility]   This video encounter was conducted with the patient's (or proxy's) verbal consent via secure, interactive audio and video telecommunications while away from clinic/office/hospital.  The patient (or proxy) was instructed to have this encounter in a suitably private space and to only have  persons present to whom they give permission to participate. In addition, patient identity was confirmed by use of name plus two identifiers.  This visit was coded based on medical decision making (MDM).

## 2023-12-23 DIAGNOSIS — Z9183 Wandering in diseases classified elsewhere: Secondary | ICD-10-CM | POA: Diagnosis not present

## 2023-12-23 DIAGNOSIS — F02818 Dementia in other diseases classified elsewhere, unspecified severity, with other behavioral disturbance: Secondary | ICD-10-CM | POA: Diagnosis not present

## 2023-12-23 DIAGNOSIS — G301 Alzheimer's disease with late onset: Secondary | ICD-10-CM | POA: Diagnosis not present

## 2023-12-23 DIAGNOSIS — Z111 Encounter for screening for respiratory tuberculosis: Secondary | ICD-10-CM | POA: Diagnosis not present

## 2024-01-08 DIAGNOSIS — G309 Alzheimer's disease, unspecified: Secondary | ICD-10-CM | POA: Diagnosis not present

## 2024-01-08 DIAGNOSIS — K219 Gastro-esophageal reflux disease without esophagitis: Secondary | ICD-10-CM | POA: Diagnosis not present

## 2024-01-08 DIAGNOSIS — E559 Vitamin D deficiency, unspecified: Secondary | ICD-10-CM | POA: Diagnosis not present

## 2024-01-08 DIAGNOSIS — E038 Other specified hypothyroidism: Secondary | ICD-10-CM | POA: Diagnosis not present

## 2024-01-08 DIAGNOSIS — R5381 Other malaise: Secondary | ICD-10-CM | POA: Diagnosis not present

## 2024-01-14 DIAGNOSIS — E782 Mixed hyperlipidemia: Secondary | ICD-10-CM | POA: Diagnosis not present

## 2024-01-14 DIAGNOSIS — R7309 Other abnormal glucose: Secondary | ICD-10-CM | POA: Diagnosis not present

## 2024-01-14 DIAGNOSIS — E038 Other specified hypothyroidism: Secondary | ICD-10-CM | POA: Diagnosis not present

## 2024-01-14 DIAGNOSIS — Z79899 Other long term (current) drug therapy: Secondary | ICD-10-CM | POA: Diagnosis not present

## 2024-01-14 DIAGNOSIS — D519 Vitamin B12 deficiency anemia, unspecified: Secondary | ICD-10-CM | POA: Diagnosis not present

## 2024-01-14 DIAGNOSIS — E559 Vitamin D deficiency, unspecified: Secondary | ICD-10-CM | POA: Diagnosis not present

## 2024-01-15 DIAGNOSIS — E559 Vitamin D deficiency, unspecified: Secondary | ICD-10-CM | POA: Diagnosis not present

## 2024-01-15 DIAGNOSIS — E782 Mixed hyperlipidemia: Secondary | ICD-10-CM | POA: Diagnosis not present

## 2024-01-15 DIAGNOSIS — G309 Alzheimer's disease, unspecified: Secondary | ICD-10-CM | POA: Diagnosis not present

## 2024-01-15 DIAGNOSIS — E038 Other specified hypothyroidism: Secondary | ICD-10-CM | POA: Diagnosis not present

## 2024-01-15 DIAGNOSIS — D519 Vitamin B12 deficiency anemia, unspecified: Secondary | ICD-10-CM | POA: Diagnosis not present

## 2024-02-09 DIAGNOSIS — K219 Gastro-esophageal reflux disease without esophagitis: Secondary | ICD-10-CM | POA: Diagnosis not present

## 2024-02-09 DIAGNOSIS — E781 Pure hyperglyceridemia: Secondary | ICD-10-CM | POA: Diagnosis not present

## 2024-02-09 DIAGNOSIS — E559 Vitamin D deficiency, unspecified: Secondary | ICD-10-CM | POA: Diagnosis not present

## 2024-02-09 DIAGNOSIS — E039 Hypothyroidism, unspecified: Secondary | ICD-10-CM | POA: Diagnosis not present

## 2024-02-09 DIAGNOSIS — E538 Deficiency of other specified B group vitamins: Secondary | ICD-10-CM | POA: Diagnosis not present

## 2024-02-09 DIAGNOSIS — Z0000 Encounter for general adult medical examination without abnormal findings: Secondary | ICD-10-CM | POA: Diagnosis not present

## 2024-02-12 DIAGNOSIS — E038 Other specified hypothyroidism: Secondary | ICD-10-CM | POA: Diagnosis not present

## 2024-02-12 DIAGNOSIS — E559 Vitamin D deficiency, unspecified: Secondary | ICD-10-CM | POA: Diagnosis not present

## 2024-02-12 DIAGNOSIS — F32A Depression, unspecified: Secondary | ICD-10-CM | POA: Diagnosis not present

## 2024-02-12 DIAGNOSIS — K219 Gastro-esophageal reflux disease without esophagitis: Secondary | ICD-10-CM | POA: Diagnosis not present

## 2024-02-12 DIAGNOSIS — E785 Hyperlipidemia, unspecified: Secondary | ICD-10-CM | POA: Diagnosis not present

## 2024-02-12 DIAGNOSIS — F419 Anxiety disorder, unspecified: Secondary | ICD-10-CM | POA: Diagnosis not present

## 2024-02-12 DIAGNOSIS — G309 Alzheimer's disease, unspecified: Secondary | ICD-10-CM | POA: Diagnosis not present

## 2024-02-16 DIAGNOSIS — E559 Vitamin D deficiency, unspecified: Secondary | ICD-10-CM | POA: Diagnosis not present

## 2024-02-16 DIAGNOSIS — G301 Alzheimer's disease with late onset: Secondary | ICD-10-CM | POA: Diagnosis not present

## 2024-02-16 DIAGNOSIS — N289 Disorder of kidney and ureter, unspecified: Secondary | ICD-10-CM | POA: Diagnosis not present

## 2024-02-16 DIAGNOSIS — F0280 Dementia in other diseases classified elsewhere without behavioral disturbance: Secondary | ICD-10-CM | POA: Diagnosis not present

## 2024-02-16 DIAGNOSIS — E782 Mixed hyperlipidemia: Secondary | ICD-10-CM | POA: Diagnosis not present

## 2024-02-16 DIAGNOSIS — J309 Allergic rhinitis, unspecified: Secondary | ICD-10-CM | POA: Diagnosis not present

## 2024-02-17 DIAGNOSIS — E782 Mixed hyperlipidemia: Secondary | ICD-10-CM | POA: Diagnosis not present

## 2024-02-17 DIAGNOSIS — E038 Other specified hypothyroidism: Secondary | ICD-10-CM | POA: Diagnosis not present

## 2024-02-17 DIAGNOSIS — G309 Alzheimer's disease, unspecified: Secondary | ICD-10-CM | POA: Diagnosis not present

## 2024-02-17 DIAGNOSIS — E559 Vitamin D deficiency, unspecified: Secondary | ICD-10-CM | POA: Diagnosis not present

## 2024-02-17 DIAGNOSIS — D519 Vitamin B12 deficiency anemia, unspecified: Secondary | ICD-10-CM | POA: Diagnosis not present

## 2024-03-17 DIAGNOSIS — E559 Vitamin D deficiency, unspecified: Secondary | ICD-10-CM | POA: Diagnosis not present

## 2024-03-17 DIAGNOSIS — E782 Mixed hyperlipidemia: Secondary | ICD-10-CM | POA: Diagnosis not present

## 2024-03-17 DIAGNOSIS — G309 Alzheimer's disease, unspecified: Secondary | ICD-10-CM | POA: Diagnosis not present

## 2024-03-17 DIAGNOSIS — D519 Vitamin B12 deficiency anemia, unspecified: Secondary | ICD-10-CM | POA: Diagnosis not present

## 2024-03-17 DIAGNOSIS — E038 Other specified hypothyroidism: Secondary | ICD-10-CM | POA: Diagnosis not present

## 2024-03-25 DIAGNOSIS — E559 Vitamin D deficiency, unspecified: Secondary | ICD-10-CM | POA: Diagnosis not present

## 2024-03-25 DIAGNOSIS — F419 Anxiety disorder, unspecified: Secondary | ICD-10-CM | POA: Diagnosis not present

## 2024-03-25 DIAGNOSIS — F32A Depression, unspecified: Secondary | ICD-10-CM | POA: Diagnosis not present

## 2024-03-25 DIAGNOSIS — K219 Gastro-esophageal reflux disease without esophagitis: Secondary | ICD-10-CM | POA: Diagnosis not present

## 2024-03-25 DIAGNOSIS — E785 Hyperlipidemia, unspecified: Secondary | ICD-10-CM | POA: Diagnosis not present

## 2024-03-25 DIAGNOSIS — G309 Alzheimer's disease, unspecified: Secondary | ICD-10-CM | POA: Diagnosis not present

## 2024-03-25 DIAGNOSIS — E038 Other specified hypothyroidism: Secondary | ICD-10-CM | POA: Diagnosis not present
